# Patient Record
Sex: Male | Born: 1965 | Race: White | Hispanic: No | Marital: Married | Smoking: Never smoker
Health system: Southern US, Community
[De-identification: ages and names within clinical notes are randomized; demographics above are authoritative.]

## PROBLEM LIST (undated history)

## (undated) DIAGNOSIS — Z789 Other specified health status: Secondary | ICD-10-CM

## (undated) HISTORY — PX: NO PAST SURGERIES: SHX2092

---

## 2007-01-19 ENCOUNTER — Emergency Department (HOSPITAL_COMMUNITY): Admission: EM | Admit: 2007-01-19 | Discharge: 2007-01-19 | Payer: Self-pay | Admitting: Emergency Medicine

## 2010-09-30 LAB — CBC
HCT: 47.1
Hemoglobin: 16.4
RDW: 12.2

## 2010-09-30 LAB — I-STAT 8, (EC8 V) (CONVERTED LAB)
BUN: 15
Chloride: 104
Glucose, Bld: 110 — ABNORMAL HIGH
pCO2, Ven: 40.4 — ABNORMAL LOW
pH, Ven: 7.428 — ABNORMAL HIGH

## 2010-09-30 LAB — DIFFERENTIAL
Basophils Absolute: 0
Eosinophils Relative: 0
Lymphocytes Relative: 15
Lymphs Abs: 1.3
Monocytes Absolute: 0.5
Monocytes Relative: 6

## 2010-09-30 LAB — POCT I-STAT CREATININE
Creatinine, Ser: 1.4
Operator id: 239701

## 2011-10-28 ENCOUNTER — Encounter (HOSPITAL_COMMUNITY): Payer: Self-pay | Admitting: *Deleted

## 2011-10-28 ENCOUNTER — Encounter (HOSPITAL_COMMUNITY): Payer: Self-pay | Admitting: Anesthesiology

## 2011-10-28 ENCOUNTER — Encounter (HOSPITAL_COMMUNITY): Payer: Self-pay | Admitting: Emergency Medicine

## 2011-10-28 ENCOUNTER — Encounter (HOSPITAL_COMMUNITY)
Admission: EM | Disposition: A | Discharge status: Home / Self Care | Payer: Self-pay | Source: Home / Self Care | Attending: Emergency Medicine

## 2011-10-28 ENCOUNTER — Observation Stay (HOSPITAL_COMMUNITY)
Admission: EM | Admit: 2011-10-28 | Discharge: 2011-10-29 | Disposition: A | Discharge status: Home / Self Care | Payer: BC Managed Care – PPO | Attending: General Surgery | Admitting: General Surgery

## 2011-10-28 ENCOUNTER — Observation Stay (HOSPITAL_COMMUNITY): Payer: BC Managed Care – PPO | Admitting: Anesthesiology

## 2011-10-28 ENCOUNTER — Emergency Department (HOSPITAL_COMMUNITY)
Admission: EM | Admit: 2011-10-28 | Discharge: 2011-10-28 | Disposition: A | Discharge status: Nursing Home (Includes ICF) | Payer: Self-pay | Source: Home / Self Care | Attending: Family Medicine | Admitting: Family Medicine

## 2011-10-28 ENCOUNTER — Emergency Department (HOSPITAL_COMMUNITY): Payer: BC Managed Care – PPO

## 2011-10-28 DIAGNOSIS — R52 Pain, unspecified: Secondary | ICD-10-CM

## 2011-10-28 DIAGNOSIS — K3580 Unspecified acute appendicitis: Principal | ICD-10-CM | POA: Insufficient documentation

## 2011-10-28 DIAGNOSIS — R1032 Left lower quadrant pain: Secondary | ICD-10-CM

## 2011-10-28 DIAGNOSIS — Z7982 Long term (current) use of aspirin: Secondary | ICD-10-CM | POA: Insufficient documentation

## 2011-10-28 DIAGNOSIS — K8020 Calculus of gallbladder without cholecystitis without obstruction: Secondary | ICD-10-CM | POA: Insufficient documentation

## 2011-10-28 DIAGNOSIS — Z79899 Other long term (current) drug therapy: Secondary | ICD-10-CM | POA: Insufficient documentation

## 2011-10-28 HISTORY — DX: Other specified health status: Z78.9

## 2011-10-28 HISTORY — PX: LAPAROSCOPIC APPENDECTOMY: SHX408

## 2011-10-28 LAB — CBC WITH DIFFERENTIAL/PLATELET
Basophils Absolute: 0 10*3/uL (ref 0.0–0.1)
HCT: 44.7 % (ref 39.0–52.0)
Hemoglobin: 15.7 g/dL (ref 13.0–17.0)
Lymphocytes Relative: 7 % — ABNORMAL LOW (ref 12–46)
Monocytes Absolute: 0.9 10*3/uL (ref 0.1–1.0)
Monocytes Relative: 6 % (ref 3–12)
Neutro Abs: 14.1 10*3/uL — ABNORMAL HIGH (ref 1.7–7.7)
Neutrophils Relative %: 88 % — ABNORMAL HIGH (ref 43–77)
RDW: 12.5 % (ref 11.5–15.5)
WBC: 16 10*3/uL — ABNORMAL HIGH (ref 4.0–10.5)

## 2011-10-28 LAB — BASIC METABOLIC PANEL
CO2: 23 mEq/L (ref 19–32)
Chloride: 102 mEq/L (ref 96–112)
Creatinine, Ser: 0.85 mg/dL (ref 0.50–1.35)
Potassium: 4.3 mEq/L (ref 3.5–5.1)

## 2011-10-28 LAB — POCT URINALYSIS DIP (DEVICE)
Bilirubin Urine: NEGATIVE
Ketones, ur: 15 mg/dL — AB
Leukocytes, UA: NEGATIVE
Nitrite: NEGATIVE

## 2011-10-28 SURGERY — APPENDECTOMY, LAPAROSCOPIC
Anesthesia: General | Site: Abdomen | Wound class: Clean Contaminated

## 2011-10-28 MED ORDER — KETOROLAC TROMETHAMINE 30 MG/ML IJ SOLN
30.0000 mg | Freq: Four times a day (QID) | INTRAMUSCULAR | Status: AC
  Administered 2011-10-29 (×2): 30 mg via INTRAVENOUS
  Filled 2011-10-28 (×2): qty 1

## 2011-10-28 MED ORDER — DIPHENHYDRAMINE HCL 12.5 MG/5ML PO ELIX
12.5000 mg | ORAL_SOLUTION | Freq: Four times a day (QID) | ORAL | Status: DC | PRN
  Filled 2011-10-28: qty 10

## 2011-10-28 MED ORDER — SODIUM CHLORIDE 0.9 % IV SOLN
3.0000 g | Freq: Four times a day (QID) | INTRAVENOUS | Status: DC
  Administered 2011-10-29 (×2): 3 g via INTRAVENOUS
  Filled 2011-10-28 (×3): qty 3

## 2011-10-28 MED ORDER — PROMETHAZINE HCL 25 MG/ML IJ SOLN: 6.2500 mg | INTRAMUSCULAR | Status: DC | PRN

## 2011-10-28 MED ORDER — ENOXAPARIN SODIUM 30 MG/0.3ML ~~LOC~~ SOLN
30.0000 mg | Freq: Two times a day (BID) | SUBCUTANEOUS | Status: DC
  Filled 2011-10-28: qty 0.3

## 2011-10-28 MED ORDER — HYDROMORPHONE HCL PF 1 MG/ML IJ SOLN
INTRAMUSCULAR | Status: AC
  Filled 2011-10-28: qty 2

## 2011-10-28 MED ORDER — SUCCINYLCHOLINE CHLORIDE 20 MG/ML IJ SOLN
INTRAMUSCULAR | Status: DC | PRN
  Administered 2011-10-28: 120 mg via INTRAVENOUS

## 2011-10-28 MED ORDER — MORPHINE SULFATE 4 MG/ML IJ SOLN: 1.0000 mg | INTRAMUSCULAR | Status: DC | PRN

## 2011-10-28 MED ORDER — NEOSTIGMINE METHYLSULFATE 1 MG/ML IJ SOLN
INTRAMUSCULAR | Status: DC | PRN
  Administered 2011-10-28: 3 mg via INTRAVENOUS

## 2011-10-28 MED ORDER — BUPIVACAINE-EPINEPHRINE (PF) 0.5% -1:200000 IJ SOLN
INTRAMUSCULAR | Status: AC
  Filled 2011-10-28: qty 10

## 2011-10-28 MED ORDER — ONDANSETRON 4 MG PO TBDP
ORAL_TABLET | ORAL | Status: AC
  Filled 2011-10-28: qty 2

## 2011-10-28 MED ORDER — GLYCOPYRROLATE 0.2 MG/ML IJ SOLN
INTRAMUSCULAR | Status: DC | PRN
  Administered 2011-10-28: 0.4 mg via INTRAVENOUS

## 2011-10-28 MED ORDER — CEFAZOLIN SODIUM-DEXTROSE 2-3 GM-% IV SOLR
INTRAVENOUS | Status: AC
  Filled 2011-10-28: qty 50

## 2011-10-28 MED ORDER — HYDROMORPHONE HCL PF 1 MG/ML IJ SOLN
1.0000 mg | Freq: Once | INTRAMUSCULAR | Status: AC
  Administered 2011-10-28: 1 mg via INTRAVENOUS
  Filled 2011-10-28: qty 1

## 2011-10-28 MED ORDER — FENTANYL CITRATE 0.05 MG/ML IJ SOLN
INTRAMUSCULAR | Status: DC | PRN
  Administered 2011-10-28 (×3): 50 ug via INTRAVENOUS
  Administered 2011-10-28: 100 ug via INTRAVENOUS

## 2011-10-28 MED ORDER — ONDANSETRON 4 MG PO TBDP
8.0000 mg | ORAL_TABLET | Freq: Once | ORAL | Status: AC
  Administered 2011-10-28: 8 mg via ORAL

## 2011-10-28 MED ORDER — HYDROMORPHONE HCL PF 1 MG/ML IJ SOLN: 0.2500 mg | INTRAMUSCULAR | Status: DC | PRN

## 2011-10-28 MED ORDER — POTASSIUM CHLORIDE IN NACL 20-0.9 MEQ/L-% IV SOLN
INTRAVENOUS | Status: DC
  Administered 2011-10-29: via INTRAVENOUS
  Filled 2011-10-28 (×3): qty 1000

## 2011-10-28 MED ORDER — IOHEXOL 300 MG/ML  SOLN
100.0000 mL | Freq: Once | INTRAMUSCULAR | Status: AC | PRN
  Administered 2011-10-28: 100 mL via INTRAVENOUS

## 2011-10-28 MED ORDER — DIPHENHYDRAMINE HCL 50 MG/ML IJ SOLN: 12.5000 mg | Freq: Four times a day (QID) | INTRAMUSCULAR | Status: DC | PRN

## 2011-10-28 MED ORDER — KCL IN DEXTROSE-NACL 20-5-0.45 MEQ/L-%-% IV SOLN
INTRAVENOUS | Status: DC
  Filled 2011-10-28 (×2): qty 1000

## 2011-10-28 MED ORDER — PROPOFOL 10 MG/ML IV BOLUS
INTRAVENOUS | Status: DC | PRN
  Administered 2011-10-28: 200 mg via INTRAVENOUS

## 2011-10-28 MED ORDER — LACTATED RINGERS IV SOLN
INTRAVENOUS | Status: DC
  Administered 2011-10-28 (×2): via INTRAVENOUS

## 2011-10-28 MED ORDER — HYDROMORPHONE HCL PF 1 MG/ML IJ SOLN: 1.0000 mg | INTRAMUSCULAR | Status: DC | PRN

## 2011-10-28 MED ORDER — LIDOCAINE HCL (CARDIAC) 20 MG/ML IV SOLN
INTRAVENOUS | Status: DC | PRN
  Administered 2011-10-28: 100 mg via INTRAVENOUS

## 2011-10-28 MED ORDER — CEFAZOLIN SODIUM-DEXTROSE 2-3 GM-% IV SOLR
INTRAVENOUS | Status: DC | PRN
  Administered 2011-10-28: 2 g via INTRAVENOUS

## 2011-10-28 MED ORDER — OXYCODONE HCL 5 MG/5ML PO SOLN: 5.0000 mg | Freq: Once | ORAL | Status: DC | PRN

## 2011-10-28 MED ORDER — IOHEXOL 300 MG/ML  SOLN
20.0000 mL | INTRAMUSCULAR | Status: AC
  Administered 2011-10-28 (×2): 20 mL via ORAL

## 2011-10-28 MED ORDER — ROCURONIUM BROMIDE 100 MG/10ML IV SOLN
INTRAVENOUS | Status: DC | PRN
  Administered 2011-10-28 (×2): 20 mg via INTRAVENOUS

## 2011-10-28 MED ORDER — HYDROCODONE-ACETAMINOPHEN 5-325 MG PO TABS: 1.0000 | ORAL_TABLET | ORAL | Status: DC | PRN

## 2011-10-28 MED ORDER — ONDANSETRON HCL 4 MG/2ML IJ SOLN: 4.0000 mg | Freq: Four times a day (QID) | INTRAMUSCULAR | Status: DC | PRN

## 2011-10-28 MED ORDER — ACETAMINOPHEN 650 MG RE SUPP: 650.0000 mg | Freq: Four times a day (QID) | RECTAL | Status: DC | PRN

## 2011-10-28 MED ORDER — ONDANSETRON HCL 4 MG PO TABS: 4.0000 mg | ORAL_TABLET | Freq: Four times a day (QID) | ORAL | Status: DC | PRN

## 2011-10-28 MED ORDER — OXYCODONE HCL 5 MG PO TABS: 5.0000 mg | ORAL_TABLET | Freq: Once | ORAL | Status: DC | PRN

## 2011-10-28 MED ORDER — HYDROMORPHONE HCL PF 1 MG/ML IJ SOLN
2.0000 mg | Freq: Once | INTRAMUSCULAR | Status: AC
  Administered 2011-10-28: 2 mg via INTRAMUSCULAR

## 2011-10-28 MED ORDER — MIDAZOLAM HCL 5 MG/5ML IJ SOLN
INTRAMUSCULAR | Status: DC | PRN
  Administered 2011-10-28: 2 mg via INTRAVENOUS

## 2011-10-28 MED ORDER — PIPERACILLIN-TAZOBACTAM 3.375 G IVPB
3.3750 g | Freq: Three times a day (TID) | INTRAVENOUS | Status: DC
  Administered 2011-10-28: 3.375 g via INTRAVENOUS
  Filled 2011-10-28 (×2): qty 50

## 2011-10-28 MED ORDER — BUPIVACAINE-EPINEPHRINE 0.5% -1:200000 IJ SOLN
INTRAMUSCULAR | Status: DC | PRN
  Administered 2011-10-28: 20 mL

## 2011-10-28 MED ORDER — SODIUM CHLORIDE 0.9 % IR SOLN
Status: DC | PRN
  Administered 2011-10-28: 1

## 2011-10-28 MED ORDER — ACETAMINOPHEN 325 MG PO TABS: 650.0000 mg | ORAL_TABLET | Freq: Four times a day (QID) | ORAL | Status: DC | PRN

## 2011-10-28 MED ORDER — ENOXAPARIN SODIUM 40 MG/0.4ML ~~LOC~~ SOLN
40.0000 mg | Freq: Every day | SUBCUTANEOUS | Status: DC
  Filled 2011-10-28: qty 0.4

## 2011-10-28 MED ORDER — KETOROLAC TROMETHAMINE 30 MG/ML IJ SOLN
INTRAMUSCULAR | Status: DC | PRN
  Administered 2011-10-28: 30 mg via INTRAVENOUS

## 2011-10-28 MED ORDER — ONDANSETRON HCL 4 MG/2ML IJ SOLN
4.0000 mg | Freq: Once | INTRAMUSCULAR | Status: AC
  Administered 2011-10-28: 4 mg via INTRAVENOUS
  Filled 2011-10-28: qty 2

## 2011-10-28 MED ORDER — ONDANSETRON HCL 4 MG/2ML IJ SOLN
INTRAMUSCULAR | Status: DC | PRN
  Administered 2011-10-28: 4 mg via INTRAVENOUS

## 2011-10-28 MED ORDER — IBUPROFEN 600 MG PO TABS
600.0000 mg | ORAL_TABLET | Freq: Four times a day (QID) | ORAL | Status: DC | PRN
  Filled 2011-10-28: qty 1

## 2011-10-28 SURGICAL SUPPLY — 42 items
APL SKNCLS STERI-STRIP NONHPOA (GAUZE/BANDAGES/DRESSINGS) ×1
APPLIER CLIP LOGIC TI 5 (MISCELLANEOUS) IMPLANT
APPLIER CLIP ROT 10 11.4 M/L (STAPLE)
APR CLP MED LRG 11.4X10 (STAPLE)
APR CLP MED LRG 33X5 (MISCELLANEOUS)
BAG SPEC RTRVL LRG 6X4 10 (ENDOMECHANICALS) ×1
BANDAGE ADHESIVE 1X3 (GAUZE/BANDAGES/DRESSINGS) ×2 IMPLANT
BENZOIN TINCTURE PRP APPL 2/3 (GAUZE/BANDAGES/DRESSINGS) ×2 IMPLANT
CANISTER SUCTION 2500CC (MISCELLANEOUS) ×2 IMPLANT
CHLORAPREP W/TINT 26ML (MISCELLANEOUS) ×2 IMPLANT
CLIP APPLIE ROT 10 11.4 M/L (STAPLE) IMPLANT
CLOTH BEACON ORANGE TIMEOUT ST (SAFETY) ×2 IMPLANT
COVER SURGICAL LIGHT HANDLE (MISCELLANEOUS) ×2 IMPLANT
CUTTER LINEAR ENDO 35 ETS (STAPLE) ×1 IMPLANT
CUTTER LINEAR ENDO 35 ETS TH (STAPLE) IMPLANT
DECANTER SPIKE VIAL GLASS SM (MISCELLANEOUS) ×1 IMPLANT
ELECT REM PT RETURN 9FT ADLT (ELECTROSURGICAL) ×2
ELECTRODE REM PT RTRN 9FT ADLT (ELECTROSURGICAL) ×1 IMPLANT
ENDOLOOP SUT PDS II  0 18 (SUTURE)
ENDOLOOP SUT PDS II 0 18 (SUTURE) IMPLANT
GLOVE SURG SIGNA 7.5 PF LTX (GLOVE) ×2 IMPLANT
GOWN PREVENTION PLUS XLARGE (GOWN DISPOSABLE) ×2 IMPLANT
GOWN STRL NON-REIN LRG LVL3 (GOWN DISPOSABLE) ×2 IMPLANT
KIT BASIN OR (CUSTOM PROCEDURE TRAY) ×2 IMPLANT
KIT ROOM TURNOVER OR (KITS) ×2 IMPLANT
NS IRRIG 1000ML POUR BTL (IV SOLUTION) ×2 IMPLANT
PAD ARMBOARD 7.5X6 YLW CONV (MISCELLANEOUS) ×4 IMPLANT
POUCH SPECIMEN RETRIEVAL 10MM (ENDOMECHANICALS) ×2 IMPLANT
RELOAD /EVU35 (ENDOMECHANICALS) IMPLANT
RELOAD CUTTER ETS 35MM STAND (ENDOMECHANICALS) IMPLANT
SCALPEL HARMONIC ACE (MISCELLANEOUS) ×2 IMPLANT
SET IRRIG TUBING LAPAROSCOPIC (IRRIGATION / IRRIGATOR) ×2 IMPLANT
SLEEVE ENDOPATH XCEL 5M (ENDOMECHANICALS) ×2 IMPLANT
SPECIMEN JAR SMALL (MISCELLANEOUS) ×2 IMPLANT
STRIP CLOSURE SKIN 1/2X4 (GAUZE/BANDAGES/DRESSINGS) ×1 IMPLANT
SUT MON AB 4-0 PC3 18 (SUTURE) ×2 IMPLANT
TOWEL OR 17X24 6PK STRL BLUE (TOWEL DISPOSABLE) ×2 IMPLANT
TOWEL OR 17X26 10 PK STRL BLUE (TOWEL DISPOSABLE) ×2 IMPLANT
TRAY LAPAROSCOPIC (CUSTOM PROCEDURE TRAY) ×2 IMPLANT
TROCAR XCEL BLUNT TIP 100MML (ENDOMECHANICALS) ×2 IMPLANT
TROCAR XCEL NON-BLD 5MMX100MML (ENDOMECHANICALS) ×2 IMPLANT
WATER STERILE IRR 1000ML POUR (IV SOLUTION) IMPLANT

## 2011-10-28 NOTE — Anesthesia Postprocedure Evaluation (Signed)
Anesthesia Post Note  Patient: Luke Frey  Procedure(s) Performed: Procedure(s) (LRB): APPENDECTOMY LAPAROSCOPIC (N/A)  Anesthesia type: general  Patient location: PACU  Post pain: Pain level controlled  Post assessment: Patient's Cardiovascular Status Stable  Last Vitals:  Filed Vitals:   10/28/11 2200  BP: 109/64  Pulse: 60  Temp:   Resp: 10    Post vital signs: Reviewed and stable  Level of consciousness: sedated  Complications: No apparent anesthesia complications

## 2011-10-28 NOTE — Op Note (Signed)
Appendectomy, Lap, Procedure Note  Indications: The patient presented with a history of right-sided abdominal pain. A CT revealed findings consistent with acute appendicitis.  Pre-operative Diagnosis: Acute appendicitis without mention of peritonitis  Post-operative Diagnosis: Same  Surgeon: Abigail Miyamoto A   Assistants: 0  Anesthesia: General endotracheal anesthesia  ASA Class: 1  Procedure Details  The patient was seen again in the Holding Room. The risks, benefits, complications, treatment options, and expected outcomes were discussed with the patient and/or family. The possibilities of reaction to medication, perforation of viscus, bleeding, recurrent infection, finding a normal appendix, the need for additional procedures, failure to diagnose a condition, and creating a complication requiring transfusion or operation were discussed. There was concurrence with the proposed plan and informed consent was obtained. The site of surgery was properly noted. The patient was taken to Operating Room, identified as Requan Hardge and the procedure verified as Appendectomy. A Time Out was held and the above information confirmed.  The patient was placed in the supine position and general anesthesia was induced, along with placement of orogastric tube, Venodyne boots, and a Foley catheter. The abdomen was prepped and draped in a sterile fashion. A one centimeter infraumbilical incision was made.  The umbilical stalk was elevated, and the midline fascia was incised with a #11 blade.  A Kelly clamp was used to confirm entrance into the peritoneal cavity.  A pursestring suture was passed around the incision with a 0 Vicryl.  The Hasson was introduced into the abdomen and the tails of the suture were used to hold the Hasson in place.   The pneumoperitoneum was then established to steady pressure of 15 mmHg.  Additional 5 mm cannulas then placed in the left lower quadrant of the abdomen and the suprapubic  region under direct visualization. A careful evaluation of the entire abdomen was carried out. The patient was placed in Trendelenburg and left lateral decubitus position. The small intestines were retracted in the cephalad and left lateral direction away from the pelvis and right lower quadrant. The patient was found to have an enlarged and inflamed appendix that was extending into the pelvis. There was no evidence of perforation.  The appendix was carefully dissected. The appendix was was skeletonized with the harmonic scalpel.   The appendix was divided at its base using an endo-GIA stapler. Minimal appendiceal stump was left in place. There was no evidence of bleeding, leakage, or complication after division of the appendix. Irrigation was also performed and irrigate suctioned from the abdomen as well.  The umbilical port site was closed with the purse string suture. There was no residual palpable fascial defect.  The trocar site skin wounds were closed with 4-0 Monocryl.  Instrument, sponge, and needle counts were correct at the conclusion of the case.   Findings: The appendix was found to be inflamed. There were not signs of necrosis.  There was not perforation. There was not abscess formation.  Estimated Blood Loss:  Minimal         Drains:none         Complications:  None; patient tolerated the procedure well.         Disposition: PACU - hemodynamically stable.         Condition: stable

## 2011-10-28 NOTE — ED Provider Notes (Signed)
Medical screening examination/treatment/procedure(s) were performed by non-physician practitioner and as supervising physician I was immediately available for consultation/collaboration.   Glynn Octave, MD 10/28/11 [phone]

## 2011-10-28 NOTE — ED Notes (Signed)
Pt. Denies any abdominal pain.  Pt. Has completed the po Contrast.  Pt. Denies any n/v/d

## 2011-10-28 NOTE — ED Notes (Signed)
Pt. oob to the bathroom, gait steady, NPO maintained.

## 2011-10-28 NOTE — Progress Notes (Signed)
Report given to Angie, RN.

## 2011-10-28 NOTE — ED Provider Notes (Signed)
History     CSN: [id number]  Arrival date & time 10/28/11  0809   First MD Initiated Contact with Patient 10/28/11 [phone]      Chief Complaint  Patient presents with  . Abdominal Pain    (Consider location/radiation/quality/duration/timing/severity/associated sxs/prior treatment) Patient is a 46 y.o. male presenting with abdominal pain. The history is provided by the patient.  Abdominal Pain The primary symptoms of the illness include abdominal pain. The primary symptoms of the illness do not include fever, nausea, vomiting, diarrhea or dysuria. The current episode started 6 to 12 hours ago. The onset of the illness was sudden. The problem has not changed since onset. The patient has not had a change in bowel habit. Symptoms associated with the illness do not include chills, anorexia, heartburn, constipation, urgency, hematuria, frequency or back pain.    History reviewed. No pertinent past medical history.  History reviewed. No pertinent past surgical history.  No family history on file.  History  Substance Use Topics  . Smoking status: Never Smoker   . Smokeless tobacco: Not on file  . Alcohol Use: Yes      Review of Systems  Constitutional: Negative for fever and chills.  Respiratory: Negative.   Cardiovascular: Negative.   Gastrointestinal: Positive for abdominal pain. Negative for heartburn, nausea, vomiting, diarrhea, constipation and anorexia.  Genitourinary: Negative for dysuria, urgency, frequency, hematuria, flank pain and testicular pain.  Musculoskeletal: Negative for back pain.    Allergies  Review of patient's allergies indicates no known allergies.  Home Medications  No current outpatient prescriptions on file.  BP 135/75  Pulse 74  Temp 98.4 F (36.9 C)  Resp 16  SpO2 100%  Physical Exam  Nursing note and vitals reviewed. Constitutional: He is oriented to person, place, and time. He appears well-developed and well-nourished. He appears  distressed.  Neck: Normal range of motion. Neck supple.  Pulmonary/Chest: Effort normal and breath sounds normal.  Abdominal: Soft. Bowel sounds are normal. He exhibits no distension and no mass. There is no hepatosplenomegaly. There is tenderness in the suprapubic area and left lower quadrant. There is no rigidity, no rebound, no guarding, no CVA tenderness and no tenderness at McBurney's point. No hernia.    Neurological: He is alert and oriented to person, place, and time.  Skin: Skin is warm and dry.    ED Course  Procedures (including critical care time)  Labs Reviewed  POCT URINALYSIS DIP (DEVICE) - Abnormal; Notable for the following:    Ketones, ur 15 (*)     Protein, ur 30 (*)     All other components within normal limits   No results found.   1. Acute abdominal pain in left lower quadrant       MDM  U/a neg.       Linna Hoff, MD 10/28/11 [phone]

## 2011-10-28 NOTE — ED Provider Notes (Signed)
History     CSN: [id number]  Arrival date & time 10/28/11  2130   First MD Initiated Contact with Patient 10/28/11 1056      Chief Complaint  Patient presents with  . Abdominal Pain    (Consider location/radiation/quality/duration/timing/severity/associated sxs/prior treatment) HPI  Patient presents to the emergency department from the urgent care for evaluation of left lower quadrant abdominal pain. The patient's pain started 12 hours ago and has been constant with a 7/10. In. He denies having any nausea, vomiting, diarrhea or fevers at home. He was seen at the urgent care noted to have a normal urinalysis. He is otherwise a healthy guy without any previous abdominal surgeries. nad vss  No past medical history on file.  No past surgical history on file.  No family history on file.  History  Substance Use Topics  . Smoking status: Never Smoker   . Smokeless tobacco: Not on file  . Alcohol Use: Yes      Review of Systems   Review of Systems  Gen: no weight loss, fevers, chills, night sweats  Eyes: no discharge or drainage, no occular pain or visual changes  Nose: no epistaxis or rhinorrhea  Mouth: no dental pain, no sore throat  Neck: no neck pain  Lungs:No wheezing, coughing or hemoptysis CV: no chest pain, palpitations, dependent edema or orthopnea  Abd: + abdominal pain, no nausea, vomiting  GU: no dysuria or gross hematuria  MSK:  No abnormalities  Neuro: no headache, no focal neurologic deficits  Skin: no abnormalities Psyche: negative.    Allergies  Review of patient's allergies indicates no known allergies.  Home Medications   Current Outpatient Rx  Name Route Sig Dispense Refill  . ASPIRIN 325 MG PO TABS Oral Take 650 mg by mouth once as needed. pain    . AMOXICILLIN-POT CLAVULANATE 500-125 MG PO TABS Oral Take 1 tablet by mouth 2 (two) times daily. For 10 days Finished 10/14    . METRONIDAZOLE 500 MG PO TABS Oral Take 500 mg by mouth 3 (three)  times daily. For 10 days  Finished 10/14    . NAPROXEN SODIUM 550 MG PO TABS Oral Take 550 mg by mouth 3 (three) times daily. For 4 days Finished 10/14      BP 130/78  Pulse 68  Temp 98 F (36.7 C) (Oral)  Resp 16  SpO2 95%  Physical Exam  Nursing note and vitals reviewed. Constitutional: He appears well-developed and well-nourished. No distress.  HENT:  Head: Normocephalic and atraumatic.  Eyes: Pupils are equal, round, and reactive to light.  Neck: Normal range of motion. Neck supple.  Cardiovascular: Normal rate and regular rhythm.   Pulmonary/Chest: Effort normal.  Abdominal: Soft. Bowel sounds are normal. He exhibits no distension. There is tenderness (LLQ to periumbilical ). There is guarding. There is no rebound.  Neurological: He is alert.  Skin: Skin is warm and dry.    ED Course  Procedures (including critical care time)  Labs Reviewed  CBC WITH DIFFERENTIAL - Abnormal; Notable for the following:    WBC 16.0 (*)     Neutrophils Relative 88 (*)     Neutro Abs 14.1 (*)     Lymphocytes Relative 7 (*)     All other components within normal limits  BASIC METABOLIC PANEL   No results found.   No diagnosis found.    MDM  Pt has elevated white count. Normal urine at Urgent Care. I have ordered CT abd/pelv with contrast.  I suspect diverticulitis. IV dilaudid and zofran ordered. Pt placed in CDU to await results. Hand off to [facility], PA-C.        Dorthula Matas, PA 10/28/11 1114

## 2011-10-28 NOTE — Anesthesia Procedure Notes (Signed)
Procedure Name: Intubation Date/Time: 10/28/2011 8:16 PM Performed by: Arlice Colt B Pre-anesthesia Checklist: Patient identified, Emergency Drugs available, Suction available, Patient being monitored and Timeout performed Patient Re-evaluated:Patient Re-evaluated prior to inductionOxygen Delivery Method: Circle system utilized Preoxygenation: Pre-oxygenation with 100% oxygen Intubation Type: IV induction and Rapid sequence Laryngoscope Size: Mac and 4 Grade View: Grade I Tube type: Oral Tube size: 7.5 mm Number of attempts: 1 Airway Equipment and Method: Stylet Placement Confirmation: ETT inserted through vocal cords under direct vision,  positive ETCO2 and breath sounds checked- equal and bilateral Secured at: 23 cm Tube secured with: Tape Dental Injury: Teeth and Oropharynx as per pre-operative assessment

## 2011-10-28 NOTE — Anesthesia Preprocedure Evaluation (Signed)
Anesthesia Evaluation  Patient identified by MRN, date of birth, ID band Patient awake    Reviewed: Allergy & Precautions, H&P , NPO status , Patient's Chart, lab work & pertinent test results  Airway Mallampati: II TM Distance: >3 FB Neck ROM: full    Dental  (+) Teeth Intact and Dental Advidsory Given   Pulmonary neg pulmonary ROS,    Pulmonary exam normal       Cardiovascular     Neuro/Psych negative neurological ROS     GI/Hepatic Neg liver ROS,   Endo/Other  negative endocrine ROS  Renal/GU negative Renal ROS  negative genitourinary   Musculoskeletal   Abdominal Normal abdominal exam  (+)   Peds  Hematology   Anesthesia Other Findings   Reproductive/Obstetrics                           Anesthesia Physical Anesthesia Plan  ASA: II and Emergent  Anesthesia Plan: Rapid Sequence and General ETT   Post-op Pain Management:    Induction:   Airway Management Planned:   Additional Equipment:   Intra-op Plan:   Post-operative Plan:   Informed Consent: I have reviewed the patients History and Physical, chart, labs and discussed the procedure including the risks, benefits and alternatives for the proposed anesthesia with the patient or authorized representative who has indicated his/her understanding and acceptance.   Dental Advisory Given  Plan Discussed with: Anesthesiologist, CRNA and Surgeon  Anesthesia Plan Comments:         Anesthesia Quick Evaluation

## 2011-10-28 NOTE — ED Notes (Signed)
Onset today 0000 sudden onset LLQ radiating to LUQ. Seen at urgent care was given Dilaudid and pain currently 7/10 "sun burn feeling under the skin"  Denies nausea or emesis Normal bowel pattern.

## 2011-10-28 NOTE — ED Notes (Signed)
Report given to Liz on [address].

## 2011-10-28 NOTE — ED Notes (Signed)
Pt c/o abd pain since midnight... Sx include: pain that starts from left side radiating towards the middle.... Denies: inj/truama to site, fevers, vomiting, nausea, diarrhea, headaches, no urinary problems... Pt seems to be in severe pain and teary swinging side to side to alleviate the pain.

## 2011-10-28 NOTE — Transfer of Care (Signed)
Immediate Anesthesia Transfer of Care Note  Patient: Luke Frey  Procedure(s) Performed: Procedure(s) (LRB) with comments: APPENDECTOMY LAPAROSCOPIC (N/A)  Patient Location: PACU  Anesthesia Type: General  Level of Consciousness: awake, alert  and oriented  Airway & Oxygen Therapy: Patient Spontanous Breathing and Patient connected to nasal cannula oxygen  Post-op Assessment: Report given to PACU RN and Post -op Vital signs reviewed and stable  Post vital signs: Reviewed and stable  Complications: No apparent anesthesia complications

## 2011-10-28 NOTE — Progress Notes (Signed)
Patient ID: Luke Frey, male   DOB: June 19, 1965, 46 y.o.   MRN: [id number]  This is a 46 year old gentleman who presents with right lower quadrant abdominal pain. He has an elevated white blood count. He has tenderness on examination. His CAT scan is positive for appendicitis. I will plan to proceed to the operating room for laparoscopic appendectomy. I discussed the risks with the patient and his wife. These include but are not limited to bleeding, infection, injury to shredding structures, the appendiceal stump leak, need to convert to an open procedure, cardiopulmonary issues, et Karie Soda. He understands and wishes to proceed. Surgery is scheduled.

## 2011-10-28 NOTE — ED Provider Notes (Signed)
11:12 AM Patient to move to CDU holding for CT abd/pelvis.  Sign out received from Marlon Pel, PA-C.  Patient with LLQ pain, concern for diverticulitis.   12:33 PM Patient reports pain is well controlled at present, 2/10.  Denies nausea.  Abdomen is soft, nondistended, TTP lower abdomen L>R, no guarding, no rebound.    1:29 PM Marlon Pel, PA-C, received call from radiology, pt has acute appendicitis.    1:53 PM Discussed all CT results with patient.  Pt is NPO.  Consult placed to surgery.  Patient declines pain medication at this time.    3:07 PM Will Marlyne Beards, PA-C, from [facility] is here to see and admit the patient.    Results for orders placed during the hospital encounter of 10/28/11  CBC WITH DIFFERENTIAL      Component Value Range   WBC 16.0 (*) 4.0 - 10.5 K/uL   RBC 5.28  4.22 - 5.81 MIL/uL   Hemoglobin 15.7  13.0 - 17.0 g/dL   HCT 16.1  09.6 - 04.5 %   MCV 84.7  78.0 - 100.0 fL   MCH 29.7  26.0 - 34.0 pg   MCHC 35.1  30.0 - 36.0 g/dL   RDW 40.9  81.1 - 91.4 %   Platelets 270  150 - 400 K/uL   Neutrophils Relative 88 (*) 43 - 77 %   Neutro Abs 14.1 (*) 1.7 - 7.7 K/uL   Lymphocytes Relative 7 (*) 12 - 46 %   Lymphs Abs 1.1  0.7 - 4.0 K/uL   Monocytes Relative 6  3 - 12 %   Monocytes Absolute 0.9  0.1 - 1.0 K/uL   Eosinophils Relative 0  0 - 5 %   Eosinophils Absolute 0.0  0.0 - 0.7 K/uL   Basophils Relative 0  0 - 1 %   Basophils Absolute 0.0  0.0 - 0.1 K/uL  BASIC METABOLIC PANEL      Component Value Range   Sodium 139  135 - 145 mEq/L   Potassium 4.3  3.5 - 5.1 mEq/L   Chloride 102  96 - 112 mEq/L   CO2 23  19 - 32 mEq/L   Glucose, Bld 150 (*) 70 - 99 mg/dL   BUN 16  6 - 23 mg/dL   Creatinine, Ser 7.82  0.50 - 1.35 mg/dL   Calcium 9.7  8.4 - 95.6 mg/dL   GFR calc non Af Amer >90  >90 mL/min   GFR calc Af Amer >90  >90 mL/min   Ct Abdomen Pelvis W Contrast  10/28/2011  *RADIOLOGY REPORT*  Clinical Data: Abdominal pain  CT ABDOMEN AND PELVIS WITH CONTRAST   Technique:  Multidetector CT imaging of the abdomen and pelvis was performed following the standard protocol during bolus administration of intravenous contrast.  Contrast: OMNIPAQUE IOHEXOL 300 MG/ML  SOLN  Comparison: None.  Findings: Mild bibasilar scarring or atelectasis.  Diffuse low attenuation of the liver. A couple focal hypodensities within the right hepatic lobe are favored to be biliary cyst or hamartoma.  Unremarkable spleen, pancreas, adrenal glands. A small filling defect within the gallbladder may reflect a stone or polyp versus fold.  No gallbladder wall thickening.  No biliary ductal dilatation.  Symmetric renal enhancement.  No hydronephrosis or hydroureter. There are a couple cystic foci on the left, incompletely characterized. Simple or mildly complex cysts are favored.  Appendix is distended with periappendiceal inflammation and wall thickening.  Measures up to 12 mm.  No free intraperitoneal  air or fluid.  No loculated fluid collection.  No lymphadenopathy.  Normal caliber vasculature.  Thin-walled bladder.  No acute osseous finding.  IMPRESSION: Acute appendicitis.  Small gallstone versus polyp suggested.  Hypodensities within the left kidney are incompletely characterized.  Favored to be cysts.  Can be confirmed with a non emergent ultrasound follow-up.  Discussed via telephone with Marlon Pel at 01:25 p.m. on 10/28/2011.   Original Report Authenticated By: Waneta Martins, M.D.       [place], [place] 10/28/11 [phone]

## 2011-10-28 NOTE — Interval H&P Note (Signed)
History and Physical Interval Note:  Seen and agree with the PA  10/28/2011 3:45 PM  Teven Mittman  has presented today for surgery, with the diagnosis of acute appendicitis  The various methods of treatment have been discussed with the patient and family. After consideration of risks, benefits and other options for treatment, the patient has consented to  Procedure(s) (LRB) with comments: APPENDECTOMY LAPAROSCOPIC (N/A) as a surgical intervention .  The patient's history has been reviewed, patient examined, no change in status, stable for surgery.  I have reviewed the patient's chart and labs.  Questions were answered to the patient's satisfaction.     Keyshawna Prouse A

## 2011-10-28 NOTE — ED Provider Notes (Signed)
Medical screening examination/treatment/procedure(s) were performed by non-physician practitioner and as supervising physician I was immediately available for consultation/collaboration.   Akaysha Cobern, MD 10/28/11 1643 

## 2011-10-28 NOTE — H&P (Signed)
Luke Frey is an 46 y.o. male.   Chief Complaint: abdominal pain HPI: Patient is a healthy 46 year old male who awoke last evening around midnight with abdominal pain primarily in his left lower quadrant. It's been ongoing all night. He presented to the emergency room this morning around 11 AM. He's had no nausea or vomiting with this. No fever that is aware of no chills. He has not eaten since 8 PM last night. Workup in the ER as a white count of 16,000 hemoglobin 15.7. Hematocrit 44.7. Platelets 270,000. Electrolytes are normal UA was normal the CT scan shows a 12 mm appendix with periappendiceal inflammation and wall thickening there is no free intraperitoneal air or fluid. He does have some gallstones, or a small colon polyp. Rest to the patient in consultation. He's still having pain and tenderness primarily now in the right lower quadrant on palpation.  No past medical history on file. No medical history  No past surgical history on file. He recently had dental surgery and was on antibiotics which he completed last Monday. No family history on file. Social History:  reports that he has never smoked. He does not have any smokeless tobacco history on file. He reports that he drinks alcohol. He reports that he does not use illicit drugs. 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  Current meds: None Allergies: No Known Allergies Family history: Father is living with hx of sleep apnea and hypertension. Mother: Living in good health.  Siblings: 2 brothers both in good health one had his appendix removed.  (Not in a hospital admission) Prior to Admission medications   Medication Sig Start Date End Date Taking? Authorizing Provider  aspirin 325 MG tablet Take 650 mg by mouth once as needed. pain   Yes Historical Provider, MD  HYDROcodone-acetaminophen (NORCO/VICODIN) 5-325 MG per tablet Take 1-2 tablets by mouth every 4 (four) hours as needed. 10/29/11   Shelly Rubenstein, MD  metroNIDAZOLE (FLAGYL) 500 MG tablet Take 500 mg by mouth 3 (three) times daily. For 10 days  Finished 10/14    Historical Provider, MD  naproxen sodium (ANAPROX) 550 MG tablet Take 550 mg by mouth 3 (three) times daily. For 4 days Finished 10/14    Historical Provider, MD    Results for orders placed during the hospital encounter of 10/28/11 (from the past 48 hour(s))  CBC WITH DIFFERENTIAL     Status: Abnormal   Collection Time   10/28/11 10:21 AM      Component Value Range Comment   WBC 16.0 (*) 4.0 - 10.5 K/uL    RBC 5.28  4.22 - 5.81 MIL/uL    Hemoglobin 15.7  13.0 - 17.0 g/dL    HCT 96.0  45.4 - 09.8 %    MCV 84.7  78.0 - 100.0 fL    MCH 29.7  26.0 - 34.0 pg    MCHC 35.1  30.0 - 36.0 g/dL    RDW 11.9  14.7 - 82.9 %    Platelets 270  150 - 400 K/uL    Neutrophils Relative 88 (*) 43 - 77 %    Neutro Abs 14.1 (*) 1.7 - 7.7 K/uL    Lymphocytes Relative 7 (*) 12 - 46 %    Lymphs Abs 1.1  0.7 - 4.0 K/uL    Monocytes Relative 6  3 - 12 %    Monocytes Absolute 0.9  0.1 - 1.0 K/uL    Eosinophils Relative 0  0 - 5 %    Eosinophils Absolute 0.0  0.0 - 0.7 K/uL    Basophils Relative 0  0 - 1 %    Basophils Absolute 0.0  0.0 - 0.1 K/uL   BASIC METABOLIC PANEL     Status: Abnormal   Collection Time   10/28/11 10:21 AM      Component Value Range Comment   Sodium 139  135 - 145 mEq/L    Potassium 4.3  3.5 - 5.1 mEq/L    Chloride 102  96 - 112 mEq/L    CO2 23  19 - 32 mEq/L    Glucose, Bld 150 (*) 70 - 99 mg/dL    BUN 16  6 - 23 mg/dL    Creatinine, Ser 1.61  0.50 - 1.35 mg/dL    Calcium 9.7  8.4 - 09.6 mg/dL    GFR calc non Af Amer >90  >90 mL/min    GFR calc Af Amer >90  >90 mL/min    Ct Abdomen Pelvis W Contrast  10/28/2011  *RADIOLOGY REPORT*  Clinical Data: Abdominal pain  CT ABDOMEN AND PELVIS WITH CONTRAST  Technique:  Multidetector CT imaging of the abdomen and pelvis was performed following the standard protocol during bolus administration of intravenous  contrast.  Contrast: OMNIPAQUE IOHEXOL 300 MG/ML  SOLN  Comparison: None.  Findings: Mild bibasilar scarring or atelectasis.  Diffuse low attenuation of the liver. A couple focal hypodensities within the right hepatic lobe are favored to be biliary cyst or hamartoma.  Unremarkable spleen, pancreas, adrenal glands. A small filling defect within the gallbladder may reflect a stone or polyp versus fold.  No gallbladder wall thickening.  No biliary ductal dilatation.  Symmetric renal enhancement.  No hydronephrosis or hydroureter. There are a couple cystic foci on the left, incompletely characterized. Simple or mildly complex cysts are favored.  Appendix is distended with periappendiceal inflammation and wall thickening.  Measures up to 12 mm.  No free intraperitoneal air or fluid.  No loculated fluid collection.  No lymphadenopathy.  Normal caliber vasculature.  Thin-walled bladder.  No acute osseous finding.  IMPRESSION: Acute appendicitis.  Small gallstone versus polyp suggested.  Hypodensities within the left kidney are incompletely characterized.  Favored to be cysts.  Can be confirmed with a non emergent ultrasound follow-up.  Discussed via telephone with Luke Frey at 01:25 p.m. on 10/28/2011.   Original Report Authenticated By: Waneta Martins, M.D.     Review of Systems  Constitutional: Negative for fever, chills, weight loss, malaise/fatigue and diaphoresis.  HENT: Negative.   Eyes: Negative.  Eye pain: Started about MN no relief started in LLQ.  Respiratory: Negative.   Cardiovascular: Negative.   Gastrointestinal: Positive for abdominal pain. Negative for heartburn, nausea, vomiting, diarrhea, constipation, blood in stool and melena.  Genitourinary: Negative.   Musculoskeletal: Negative.   Skin: Negative.   Neurological: Negative.  Negative for weakness.  Endo/Heme/Allergies: Negative.   Psychiatric/Behavioral: Negative.     Blood pressure 121/73, pulse 69, temperature 97.9 F  (36.6 C), temperature source Oral, resp. rate 16, SpO2 99.00%. Physical Exam  Constitutional: He is oriented to person, place, and time. He appears well-developed and well-nourished. No distress.  HENT:  Head: Normocephalic and atraumatic.  Nose: Nose normal.  Eyes: Conjunctivae normal and EOM are normal. Pupils are equal, round, and reactive to light. Right eye exhibits no discharge. Left eye exhibits no discharge. No scleral icterus.  Neck: Normal range of motion. Neck supple. No JVD present.  No tracheal deviation present. No thyromegaly present.  Cardiovascular: Normal rate, regular rhythm, normal heart sounds and intact distal pulses.  Exam reveals no gallop.   No murmur heard. Respiratory: Effort normal and breath sounds normal. No stridor. No respiratory distress. He has no wheezes. He has no rales. He exhibits no tenderness.  GI: Soft. Bowel sounds are normal. He exhibits no distension and no mass. There is tenderness (He complains of LLQ, but on palpation he has more pain in RLQ than LLQ.). There is no rebound and no guarding.  Musculoskeletal: Normal range of motion. He exhibits no edema and no tenderness.  Lymphadenopathy:    He has no cervical adenopathy.  Neurological: He is alert and oriented to person, place, and time. No cranial nerve deficit.  Skin: Skin is warm and dry. No rash noted. He is not diaphoretic. No erythema. No pallor.  Psychiatric: He has a normal mood and affect. His behavior is normal. Judgment and thought content normal.     Assessment/Plan Impression: 1. Acute appendicitis  Plan: N.p.o., hydration, IV antibiotics, plan surgery later this evening. Will [facility] physician assistant for Dr. Violeta Gelinas. He has been seen and will be done by Dr. Magnus Ivan.   Kayliee Atienza 10/28/2011, 3:09 PM

## 2011-10-28 NOTE — H&P (View-Only) (Signed)
Patient ID: Luke Frey, male   DOB: 01/06/1966, 46 y.o.   MRN: [id number]  This is a 46-year-old gentleman who presents with right lower quadrant abdominal pain. He has an elevated white blood count. He has tenderness on examination. His CAT scan is positive for appendicitis. I will plan to proceed to the operating room for laparoscopic appendectomy. I discussed the risks with the patient and his wife. These include but are not limited to bleeding, infection, injury to shredding structures, the appendiceal stump leak, need to convert to an open procedure, cardiopulmonary issues, et cetera. He understands and wishes to proceed. Surgery is scheduled. 

## 2011-10-29 MED ORDER — HYDROCODONE-ACETAMINOPHEN 5-325 MG PO TABS: 1.0000 | ORAL_TABLET | ORAL | Status: DC | PRN

## 2011-10-29 NOTE — Discharge Summary (Signed)
Physician Discharge Summary  Patient ID: Luke Frey MRN: [id number] DOB/AGE: 10-02-1965 46 y.o.  Admit date: 10/28/2011 Discharge date: 10/29/2011  Admission Diagnoses:  Discharge Diagnoses:  Active Problems:  * No active hospital problems. *  acute appendicitis  Discharged Condition: good  Hospital Course: uneventful post op recovery.  Discharged POD#1  Consults: None  Significant Diagnostic Studies: CT  Treatments: surgery: laparoscopic appendectomy  Discharge Exam: Blood pressure 118/66, pulse 76, temperature 98.5 F (36.9 C), temperature source Oral, resp. rate 18, height 5\' 10"  (1.778 m), weight 190 lb (86.183 kg), SpO2 96.00%. General appearance: alert and no distress Resp: clear to auscultation bilaterally Cardio: regular rate and rhythm, S1, S2 normal, no murmur, click, rub or gallop Incision/Wound: incisions clean  Disposition: 04-Intermediate Care Facility     Medication List     As of 10/29/2011  7:07 AM    ASK your doctor about these medications         amoxicillin-clavulanate 500-125 MG per tablet   Commonly known as: AUGMENTIN   Take 1 tablet by mouth 2 (two) times daily. For 10 days  Finished 10/14      aspirin 325 MG tablet   Take 650 mg by mouth once as needed. pain      metroNIDAZOLE 500 MG tablet   Commonly known as: FLAGYL   Take 500 mg by mouth 3 (three) times daily. For 10 days   Finished 10/14      naproxen sodium 550 MG tablet   Commonly known as: ANAPROX   Take 550 mg by mouth 3 (three) times daily. For 4 days  Finished 10/14           Follow-up Information    Follow up with [facility] A, MD. (As needed)    Contact information:   [address] [place] [address] [phone]          Signed: Shelly Rubenstein 10/29/2011, 7:07 AM

## 2011-10-29 NOTE — Progress Notes (Signed)
1 Day Post-Op  Subjective: POD#1 comfortable  Objective: Vital signs in last 24 hours: Temp:  [97.1 F (36.2 C)-98.5 F (36.9 C)] 98.5 F (36.9 C) (10/19 0535) Pulse Rate:  [58-94] 76  (10/19 0535) Resp:  [10-29] 18  (10/19 0535) BP: (107-156)/(57-84) 118/66 mmHg (10/19 0535) SpO2:  [91 %-100 %] 96 % (10/19 0535) Weight:  [190 lb (86.183 kg)] 190 lb (86.183 kg) (10/18 2250) Last BM Date: 10/28/11  Intake/Output from previous day: 10/18 0701 - 10/19 0700 In: 1917 [I.V.:1917] Out: 1450 [Urine:1450] Intake/Output this shift:    Lungs clear Abdomen soft, dressings dry  Lab Results:   Basename 10/28/11 1021  WBC 16.0*  HGB 15.7  HCT 44.7  PLT 270   BMET  Basename 10/28/11 1021  NA 139  K 4.3  CL 102  CO2 23  GLUCOSE 150*  BUN 16  CREATININE 0.85  CALCIUM 9.7   PT/INR No results found for this basename: LABPROT:2,INR:2 in the last 72 hours ABG No results found for this basename: PHART:2,PCO2:2,PO2:2,HCO3:2 in the last 72 hours  Studies/Results: Ct Abdomen Pelvis W Contrast  10/28/2011  *RADIOLOGY REPORT*  Clinical Data: Abdominal pain  CT ABDOMEN AND PELVIS WITH CONTRAST  Technique:  Multidetector CT imaging of the abdomen and pelvis was performed following the standard protocol during bolus administration of intravenous contrast.  Contrast: OMNIPAQUE IOHEXOL 300 MG/ML  SOLN  Comparison: None.  Findings: Mild bibasilar scarring or atelectasis.  Diffuse low attenuation of the liver. A couple focal hypodensities within the right hepatic lobe are favored to be biliary cyst or hamartoma.  Unremarkable spleen, pancreas, adrenal glands. A small filling defect within the gallbladder may reflect a stone or polyp versus fold.  No gallbladder wall thickening.  No biliary ductal dilatation.  Symmetric renal enhancement.  No hydronephrosis or hydroureter. There are a couple cystic foci on the left, incompletely characterized. Simple or mildly complex cysts are favored.   Appendix is distended with periappendiceal inflammation and wall thickening.  Measures up to 12 mm.  No free intraperitoneal air or fluid.  No loculated fluid collection.  No lymphadenopathy.  Normal caliber vasculature.  Thin-walled bladder.  No acute osseous finding.  IMPRESSION: Acute appendicitis.  Small gallstone versus polyp suggested.  Hypodensities within the left kidney are incompletely characterized.  Favored to be cysts.  Can be confirmed with a non emergent ultrasound follow-up.  Discussed via telephone with Marlon Pel at 01:25 p.m. on 10/28/2011.   Original Report Authenticated By: Waneta Martins, M.D.     Anti-infectives: Anti-infectives     Start     Dose/Rate Route Frequency Ordered Stop   10/28/11 2359   Ampicillin-Sulbactam (UNASYN) 3 g in sodium chloride 0.9 % 100 mL IVPB        3 g 100 mL/hr over 60 Minutes Intravenous Every 6 hours 10/28/11 2300 10/29/11 1759   10/28/11 1515   piperacillin-tazobactam (ZOSYN) IVPB 3.375 g  Status:  Discontinued        3.375 g 12.5 mL/hr over 240 Minutes Intravenous 3 times per day 10/28/11 1508 10/28/11 2300          Assessment/Plan: s/p Procedure(s) (LRB) with comments: APPENDECTOMY LAPAROSCOPIC (N/A)  Discharge home  LOS: 1 day    Dhrithi Riche A 10/29/2011

## 2011-11-01 ENCOUNTER — Encounter (HOSPITAL_COMMUNITY): Payer: Self-pay | Admitting: Surgery

## 2016-08-18 ENCOUNTER — Other Ambulatory Visit: Payer: Self-pay | Admitting: Internal Medicine

## 2016-08-18 DIAGNOSIS — M7989 Other specified soft tissue disorders: Secondary | ICD-10-CM

## 2016-08-19 ENCOUNTER — Ambulatory Visit
Admission: RE | Admit: 2016-08-19 | Discharge: 2016-08-19 | Disposition: A | Discharge status: Home / Self Care | Payer: BC Managed Care – PPO | Source: Ambulatory Visit | Attending: Internal Medicine | Admitting: Internal Medicine

## 2016-08-19 DIAGNOSIS — M7989 Other specified soft tissue disorders: Secondary | ICD-10-CM

## 2019-05-28 ENCOUNTER — Other Ambulatory Visit: Payer: Self-pay | Admitting: Internal Medicine

## 2019-05-28 DIAGNOSIS — Z8249 Family history of ischemic heart disease and other diseases of the circulatory system: Secondary | ICD-10-CM

## 2019-05-28 DIAGNOSIS — E781 Pure hyperglyceridemia: Secondary | ICD-10-CM

## 2020-04-05 ENCOUNTER — Encounter (HOSPITAL_COMMUNITY): Payer: Self-pay | Admitting: Emergency Medicine

## 2020-04-05 ENCOUNTER — Emergency Department (HOSPITAL_COMMUNITY): Payer: BC Managed Care – PPO

## 2020-04-05 ENCOUNTER — Other Ambulatory Visit: Payer: Self-pay

## 2020-04-05 ENCOUNTER — Observation Stay (HOSPITAL_COMMUNITY)
Admission: EM | Admit: 2020-04-05 | Discharge: 2020-04-08 | Disposition: A | Discharge status: Home / Self Care | Payer: BC Managed Care – PPO | Attending: Student | Admitting: Student

## 2020-04-05 DIAGNOSIS — R350 Frequency of micturition: Secondary | ICD-10-CM

## 2020-04-05 DIAGNOSIS — E663 Overweight: Secondary | ICD-10-CM | POA: Insufficient documentation

## 2020-04-05 DIAGNOSIS — K5780 Diverticulitis of intestine, part unspecified, with perforation and abscess without bleeding: Secondary | ICD-10-CM | POA: Diagnosis not present

## 2020-04-05 DIAGNOSIS — Z6829 Body mass index (BMI) 29.0-29.9, adult: Secondary | ICD-10-CM | POA: Diagnosis not present

## 2020-04-05 DIAGNOSIS — Z20822 Contact with and (suspected) exposure to covid-19: Secondary | ICD-10-CM | POA: Insufficient documentation

## 2020-04-05 DIAGNOSIS — Z79899 Other long term (current) drug therapy: Secondary | ICD-10-CM | POA: Insufficient documentation

## 2020-04-05 DIAGNOSIS — Z7982 Long term (current) use of aspirin: Secondary | ICD-10-CM | POA: Diagnosis not present

## 2020-04-05 DIAGNOSIS — R1032 Left lower quadrant pain: Secondary | ICD-10-CM | POA: Diagnosis present

## 2020-04-05 DIAGNOSIS — K5732 Diverticulitis of large intestine without perforation or abscess without bleeding: Secondary | ICD-10-CM

## 2020-04-05 DIAGNOSIS — K5792 Diverticulitis of intestine, part unspecified, without perforation or abscess without bleeding: Secondary | ICD-10-CM | POA: Diagnosis not present

## 2020-04-05 DIAGNOSIS — A419 Sepsis, unspecified organism: Secondary | ICD-10-CM | POA: Diagnosis not present

## 2020-04-05 LAB — COMPREHENSIVE METABOLIC PANEL
ALT: 17 U/L (ref 0–44)
AST: 14 U/L — ABNORMAL LOW (ref 15–41)
Albumin: 4.3 g/dL (ref 3.5–5.0)
Alkaline Phosphatase: 58 U/L (ref 38–126)
Anion gap: 10 (ref 5–15)
BUN: 12 mg/dL (ref 6–20)
CO2: 23 mmol/L (ref 22–32)
Calcium: 8.8 mg/dL — ABNORMAL LOW (ref 8.9–10.3)
Chloride: 105 mmol/L (ref 98–111)
Creatinine, Ser: 1.09 mg/dL (ref 0.61–1.24)
GFR, Estimated: >60 mL/min (ref >60)
Glucose, Bld: 119 mg/dL — ABNORMAL HIGH (ref 70–99)
Potassium: 4 mmol/L (ref 3.5–5.1)
Sodium: 138 mmol/L (ref 135–145)
Total Bilirubin: 1.2 mg/dL (ref 0.3–1.2)
Total Protein: 7.7 g/dL (ref 6.5–8.1)

## 2020-04-05 LAB — CBC
HCT: 44.7 % (ref 39.0–52.0)
Hemoglobin: 15.4 g/dL (ref 13.0–17.0)
MCH: 29.9 pg (ref 26.0–34.0)
MCHC: 34.5 g/dL (ref 30.0–36.0)
MCV: 86.8 fL (ref 80.0–100.0)
Platelets: 223 10*3/uL (ref 150–400)
RBC: 5.15 MIL/uL (ref 4.22–5.81)
RDW: 12.4 % (ref 11.5–15.5)
WBC: 13 10*3/uL — ABNORMAL HIGH (ref 4.0–10.5)
nRBC: 0 % (ref 0.0–0.2)

## 2020-04-05 LAB — URINALYSIS, ROUTINE W REFLEX MICROSCOPIC
Bacteria, UA: NONE SEEN
Bilirubin Urine: NEGATIVE
Glucose, UA: NEGATIVE mg/dL
Ketones, ur: 20 mg/dL — AB
Leukocytes,Ua: NEGATIVE
Nitrite: NEGATIVE
Protein, ur: NEGATIVE mg/dL
Specific Gravity, Urine: >1.046 — ABNORMAL HIGH (ref 1.005–1.030)
pH: 5 (ref 5.0–8.0)

## 2020-04-05 LAB — RESP PANEL BY RT-PCR (FLU A&B, COVID) ARPGX2
Influenza A by PCR: NEGATIVE
Influenza B by PCR: NEGATIVE
SARS Coronavirus 2 by RT PCR: NEGATIVE

## 2020-04-05 LAB — HIV ANTIBODY (ROUTINE TESTING W REFLEX): HIV Screen 4th Generation wRfx: NONREACTIVE

## 2020-04-05 LAB — LIPASE, BLOOD: Lipase: 27 U/L (ref 11–51)

## 2020-04-05 LAB — LACTIC ACID, PLASMA: Lactic Acid, Venous: 1.1 mmol/L (ref 0.5–1.9)

## 2020-04-05 MED ORDER — ACETAMINOPHEN 650 MG RE SUPP: 650.0000 mg | Freq: Four times a day (QID) | RECTAL | Status: DC | PRN

## 2020-04-05 MED ORDER — HYDRALAZINE HCL 20 MG/ML IJ SOLN: 5.0000 mg | Freq: Three times a day (TID) | INTRAMUSCULAR | Status: DC | PRN

## 2020-04-05 MED ORDER — SODIUM CHLORIDE 0.9 % IV SOLN
2.0000 g | Freq: Once | INTRAVENOUS | Status: AC
  Administered 2020-04-05: 2 g via INTRAVENOUS
  Filled 2020-04-05: qty 20

## 2020-04-05 MED ORDER — LACTATED RINGERS IV BOLUS (SEPSIS)
1000.0000 mL | Freq: Once | INTRAVENOUS | Status: AC
  Administered 2020-04-05: 1000 mL via INTRAVENOUS

## 2020-04-05 MED ORDER — MORPHINE SULFATE (PF) 2 MG/ML IV SOLN
2.0000 mg | INTRAVENOUS | Status: DC | PRN
Start: 2020-04-05 — End: 2020-04-08

## 2020-04-05 MED ORDER — HYDROCODONE-ACETAMINOPHEN 5-325 MG PO TABS
1.0000 | ORAL_TABLET | ORAL | Status: DC | PRN
Start: 2020-04-05 — End: 2020-04-08
  Administered 2020-04-06 – 2020-04-07 (×2): 1 via ORAL
  Filled 2020-04-05 (×2): qty 1

## 2020-04-05 MED ORDER — SODIUM CHLORIDE 0.9 % IV SOLN
INTRAVENOUS | Status: DC | PRN
  Administered 2020-04-05: 500 mL via INTRAVENOUS

## 2020-04-05 MED ORDER — PIPERACILLIN-TAZOBACTAM 3.375 G IVPB
3.3750 g | Freq: Three times a day (TID) | INTRAVENOUS | Status: DC
  Administered 2020-04-05 – 2020-04-08 (×9): 3.375 g via INTRAVENOUS
  Filled 2020-04-05 (×9): qty 50

## 2020-04-05 MED ORDER — METRONIDAZOLE IN NACL 5-0.79 MG/ML-% IV SOLN: 500.0000 mg | Freq: Once | INTRAVENOUS | Status: DC

## 2020-04-05 MED ORDER — MORPHINE SULFATE (PF) 4 MG/ML IV SOLN
4.0000 mg | Freq: Once | INTRAVENOUS | Status: AC
  Administered 2020-04-05: 4 mg via INTRAVENOUS
  Filled 2020-04-05: qty 1

## 2020-04-05 MED ORDER — ENOXAPARIN SODIUM 40 MG/0.4ML ~~LOC~~ SOLN
40.0000 mg | SUBCUTANEOUS | Status: DC
  Administered 2020-04-05 – 2020-04-07 (×3): 40 mg via SUBCUTANEOUS
  Filled 2020-04-05 (×3): qty 0.4

## 2020-04-05 MED ORDER — ACETAMINOPHEN 325 MG PO TABS
650.0000 mg | ORAL_TABLET | Freq: Four times a day (QID) | ORAL | Status: DC | PRN
  Administered 2020-04-05: 650 mg via ORAL
  Filled 2020-04-05: qty 2

## 2020-04-05 MED ORDER — IOHEXOL 300 MG/ML  SOLN
100.0000 mL | Freq: Once | INTRAMUSCULAR | Status: AC | PRN
  Administered 2020-04-05: 100 mL via INTRAVENOUS

## 2020-04-05 MED ORDER — SODIUM CHLORIDE 0.9% FLUSH
3.0000 mL | Freq: Two times a day (BID) | INTRAVENOUS | Status: DC
  Administered 2020-04-05 – 2020-04-07 (×6): 3 mL via INTRAVENOUS

## 2020-04-05 MED ORDER — HYDROMORPHONE HCL 1 MG/ML IJ SOLN
1.0000 mg | Freq: Once | INTRAMUSCULAR | Status: AC
  Administered 2020-04-05: 1 mg via INTRAVENOUS
  Filled 2020-04-05: qty 1

## 2020-04-05 NOTE — Consult Note (Signed)
CC: LLQ pain  Requesting provider: Dr Dairl Ponder  HPI: Luke Frey is an 55 y.o. male who is here for increasing abd pain and fever since Sat evening.  He describes pain located in his LLQ that radiates to his L leg and scrotum.  He's never had anything like this before.  He has regular bowel habits and does not smoke.  Last colonoscopy was ~2 yrs ago with Dr Loreta Ave  Past Medical History:  Diagnosis Date   No pertinent past medical history     Past Surgical History:  Procedure Laterality Date   LAPAROSCOPIC APPENDECTOMY  10/28/2011   Procedure: APPENDECTOMY LAPAROSCOPIC;  Surgeon: Shelly Rubenstein, MD;  Location: [facility] OR;  Service: General;  Laterality: N/A;   NO PAST SURGERIES      History reviewed. No pertinent family history.  Social:  reports that he has never smoked. He has never used smokeless tobacco. He reports current alcohol use. He reports that he does not use drugs.  Allergies: No Known Allergies  Medications: I have reviewed the patient's current medications.  Results for orders placed or performed during the hospital encounter of 04/05/20 (from the past 48 hour(s))  Lipase, blood     Status: None   Collection Time: 04/05/20 11:07 AM  Result Value Ref Range   Lipase 27 11 - 51 U/L    Comment: Performed at [facility], [address] [place], [place] [address]  Comprehensive metabolic panel     Status: Abnormal   Collection Time: 04/05/20 11:07 AM  Result Value Ref Range   Sodium 138 135 - 145 mmol/L   Potassium 4.0 3.5 - 5.1 mmol/L   Chloride 105 98 - 111 mmol/L   CO2 23 22 - 32 mmol/L   Glucose, Bld 119 (H) 70 - 99 mg/dL    Comment: Glucose reference range applies only to samples taken after fasting for at least 8 hours.   BUN 12 6 - 20 mg/dL   Creatinine, Ser 6.04 0.61 - 1.24 mg/dL   Calcium 8.8 (L) 8.9 - 10.3 mg/dL   Total Protein 7.7 6.5 - 8.1 g/dL   Albumin 4.3 3.5 - 5.0 g/dL   AST 14 (L) 15 - 41 U/L   ALT 17 0 - 44 U/L   Alkaline  Phosphatase 58 38 - 126 U/L   Total Bilirubin 1.2 0.3 - 1.2 mg/dL   GFR, Estimated >54 >09 mL/min    Comment: (NOTE) Calculated using the CKD-EPI Creatinine Equation (2021)    Anion gap 10 5 - 15    Comment: Performed at [facility], [address] [place], [place] [address]  CBC     Status: Abnormal   Collection Time: 04/05/20 11:07 AM  Result Value Ref Range   WBC 13.0 (H) 4.0 - 10.5 K/uL   RBC 5.15 4.22 - 5.81 MIL/uL   Hemoglobin 15.4 13.0 - 17.0 g/dL   HCT 47.8 29.5 - 62.1 %   MCV 86.8 80.0 - 100.0 fL   MCH 29.9 26.0 - 34.0 pg   MCHC 34.5 30.0 - 36.0 g/dL   RDW 30.8 65.7 - 84.6 %   Platelets 223 150 - 400 K/uL   nRBC 0.0 0.0 - 0.2 %    Comment: Performed at [facility], [address], [place], [place] [address]  Resp Panel by RT-PCR (Flu A&B, Covid) Nasopharyngeal Swab     Status: None   Collection Time: 04/05/20 12:00 PM   Specimen: Nasopharyngeal Swab;  Nasopharyngeal(NP) swabs in vial transport medium  Result Value Ref Range   SARS Coronavirus 2 by RT PCR NEGATIVE NEGATIVE    Comment: (NOTE) SARS-CoV-2 target nucleic acids are NOT DETECTED.  The SARS-CoV-2 RNA is generally detectable in upper respiratory specimens during the acute phase of infection. The lowest concentration of SARS-CoV-2 viral copies this assay can detect is 138 copies/mL. A negative result does not preclude SARS-Cov-2 infection and should not be used as the sole basis for treatment or other patient management decisions. A negative result may occur with  improper specimen collection/handling, submission of specimen other than nasopharyngeal swab, presence of viral mutation(s) within the areas targeted by this assay, and inadequate number of viral copies(<138 copies/mL). A negative result must be combined with clinical observations, patient history, and epidemiological information. The expected result is Negative.  Fact Sheet for Patients:   BloggerCourse.com  Fact Sheet for Healthcare Providers:  SeriousBroker.it  This test is no t yet approved or cleared by the [place] FDA and  has been authorized for detection and/or diagnosis of SARS-CoV-2 by FDA under an Emergency Use Authorization (EUA). This EUA will remain  in effect (meaning this test can be used) for the duration of the COVID-19 declaration under Section 564(b)(1) of the Act, 21 U.S.C.section 360bbb-3(b)(1), unless the authorization is terminated  or revoked sooner.       Influenza A by PCR NEGATIVE NEGATIVE   Influenza B by PCR NEGATIVE NEGATIVE    Comment: (NOTE) The Xpert Xpress SARS-CoV-2/FLU/RSV plus assay is intended as an aid in the diagnosis of influenza from Nasopharyngeal swab specimens and should not be used as a sole basis for treatment. Nasal washings and aspirates are unacceptable for Xpert Xpress SARS-CoV-2/FLU/RSV testing.  Fact Sheet for Patients: BloggerCourse.com  Fact Sheet for Healthcare Providers: SeriousBroker.it  This test is not yet approved or cleared by the [place] FDA and has been authorized for detection and/or diagnosis of SARS-CoV-2 by FDA under an Emergency Use Authorization (EUA). This EUA will remain in effect (meaning this test can be used) for the duration of the COVID-19 declaration under Section 564(b)(1) of the Act, 21 U.S.C. section 360bbb-3(b)(1), unless the authorization is terminated or revoked.  Performed at [facility], [address], [place], [place] [address]   HIV Antibody (routine testing w rflx)     Status: None   Collection Time: 04/05/20  2:54 PM  Result Value Ref Range   HIV Screen 4th Generation wRfx Non Reactive Non Reactive    Comment: Performed at [facility], [address], [place], [place] [address]  Lactic acid, plasma     Status: None   Collection  Time: 04/05/20  3:36 PM  Result Value Ref Range   Lactic Acid, Venous 1.1 0.5 - 1.9 mmol/L    Comment: Performed at [facility], [address], [place], [place] [address]  Urinalysis, Routine w reflex microscopic Urine, Clean Catch     Status: Abnormal   Collection Time: 04/05/20  5:00 PM  Result Value Ref Range   Color, Urine YELLOW YELLOW   APPearance CLEAR CLEAR   Specific Gravity, Urine >1.046 (H) 1.005 - 1.030   pH 5.0 5.0 - 8.0   Glucose, UA NEGATIVE NEGATIVE mg/dL   Hgb urine dipstick SMALL (A) NEGATIVE   Bilirubin Urine NEGATIVE NEGATIVE   Ketones, ur 20 (A) NEGATIVE mg/dL   Protein, ur NEGATIVE NEGATIVE mg/dL   Nitrite NEGATIVE NEGATIVE   Leukocytes,Ua NEGATIVE NEGATIVE  RBC / HPF 0-5 0 - 5 RBC/hpf   WBC, UA 0-5 0 - 5 WBC/hpf   Bacteria, UA NONE SEEN NONE SEEN   Mucus PRESENT     Comment: Performed at [facility], [address], [place], [place] [address]    CT ABDOMEN PELVIS W CONTRAST  Result Date: 04/05/2020 CLINICAL DATA:  LEFT lower quadrant pain since yesterday with fever and change in bowel movements EXAM: CT ABDOMEN AND PELVIS WITH CONTRAST TECHNIQUE: Multidetector CT imaging of the abdomen and pelvis was performed using the standard protocol following bolus administration of intravenous contrast. Sagittal and coronal MPR images reconstructed from axial data set. CONTRAST:  OMNIPAQUE IOHEXOL 300 MG/ML SOLN IV. No oral contrast. COMPARISON:  None FINDINGS: Lower chest: Minimal dependent density/atelectasis at lung bases Hepatobiliary: Fatty infiltration of liver. Subtle nodular foci within gallbladder question small gallstones versus polyps. Two small probable hepatic cysts. Pancreas: Normal appearance Spleen: Normal appearance Adrenals/Urinary Tract: LEFT renal cysts. Adrenal glands, kidneys, ureters, and bladder otherwise normal appearance. Stomach/Bowel: Prior appendectomy. Diverticulosis of descending and sigmoid colon  with wall thickening and pericolic infiltrative changes at distal descending colon consistent with acute diverticulitis. Infiltration/edema extends into the proximal sigmoid mesocolon and lateral conal fascia as well as along the posterior pararenal fascia. Small focal collection of gas adjacent to colon 1.6 cm diameter consistent with contained local perforation. No discrete abscess. Stomach and remaining bowel loops unremarkable. Vascular/Lymphatic: Vascular structures patent.  No adenopathy. Reproductive: Prostate gland and seminal vesicles normal appearance Other: Small LEFT inguinal hernia containing fat. No free air or free fluid. Musculoskeletal: No acute osseous findings. IMPRESSION: Acute diverticulitis of the distal descending colon with pericolic infiltrative changes extending into the proximal sigmoid mesocolon and lateral conal fascia as well as along the posterior pararenal fascia. Small focal collection of gas adjacent to colon consistent with contained perforation. No free intraperitoneal air or discrete abscess. Fatty infiltration of liver with small probable hepatic cysts. Small LEFT inguinal hernia containing fat. Findings called to Trudee Grip PA on 04/05/2020 at 1157 hours. Electronically Signed   By: Ulyses Southward M.D.   On: 04/05/2020 11:58    ROS - all of the below systems have been reviewed with the patient and positives are indicated with bold text General: chills, fever or night sweats Eyes: blurry vision or double vision ENT: epistaxis or sore throat Allergy/Immunology: itchy/watery eyes or nasal congestion Hematologic/Lymphatic: bleeding problems, blood clots or swollen lymph nodes Endocrine: temperature intolerance or unexpected weight changes Breast: new or changing breast lumps or nipple discharge Resp: cough, shortness of breath, or wheezing CV: chest pain or dyspnea on exertion GI: as per HPI GU: dysuria, trouble voiding, or hematuria MSK: joint pain or joint  stiffness Neuro: TIA or stroke symptoms Derm: pruritus and skin lesion changes Psych: anxiety and depression  PE Blood pressure (!) 142/88, pulse 93, temperature 99.5 F (37.5 C), temperature source Oral, resp. rate 18, height 5\' 9"  (1.753 m), weight 90.7 kg, SpO2 96 %. Constitutional: NAD; conversant; no deformities Eyes: Moist conjunctiva; no lid lag; anicteric; PERRL Neck: Trachea midline; no thyromegaly Lungs: Normal respiratory effort; no tactile fremitus CV: RRR; no palpable thrills; no pitting edema GI: Abd TTP LLQ; no palpable hepatosplenomegaly MSK: Normal range of motion of extremities; no clubbing/cyanosis Psychiatric: Appropriate affect; alert and oriented x3 Lymphatic: No palpable cervical or axillary lymphadenopathy  Results for orders placed or performed during the hospital encounter of 04/05/20 (from the past 48 hour(s))  Lipase, blood  Status: None   Collection Time: 04/05/20 11:07 AM  Result Value Ref Range   Lipase 27 11 - 51 U/L    Comment: Performed at [facility], [address] [place], [place] [address]  Comprehensive metabolic panel     Status: Abnormal   Collection Time: 04/05/20 11:07 AM  Result Value Ref Range   Sodium 138 135 - 145 mmol/L   Potassium 4.0 3.5 - 5.1 mmol/L   Chloride 105 98 - 111 mmol/L   CO2 23 22 - 32 mmol/L   Glucose, Bld 119 (H) 70 - 99 mg/dL    Comment: Glucose reference range applies only to samples taken after fasting for at least 8 hours.   BUN 12 6 - 20 mg/dL   Creatinine, Ser 6.04 0.61 - 1.24 mg/dL   Calcium 8.8 (L) 8.9 - 10.3 mg/dL   Total Protein 7.7 6.5 - 8.1 g/dL   Albumin 4.3 3.5 - 5.0 g/dL   AST 14 (L) 15 - 41 U/L   ALT 17 0 - 44 U/L   Alkaline Phosphatase 58 38 - 126 U/L   Total Bilirubin 1.2 0.3 - 1.2 mg/dL   GFR, Estimated >54 >09 mL/min    Comment: (NOTE) Calculated using the CKD-EPI Creatinine Equation (2021)    Anion gap 10 5 - 15    Comment: Performed at [facility], [address] [place], [place] [address]  CBC     Status: Abnormal   Collection Time: 04/05/20 11:07 AM  Result Value Ref Range   WBC 13.0 (H) 4.0 - 10.5 K/uL   RBC 5.15 4.22 - 5.81 MIL/uL   Hemoglobin 15.4 13.0 - 17.0 g/dL   HCT 47.8 29.5 - 62.1 %   MCV 86.8 80.0 - 100.0 fL   MCH 29.9 26.0 - 34.0 pg   MCHC 34.5 30.0 - 36.0 g/dL   RDW 30.8 65.7 - 84.6 %   Platelets 223 150 - 400 K/uL   nRBC 0.0 0.0 - 0.2 %    Comment: Performed at [facility], [address] [place], [place] [address]  Resp Panel by RT-PCR (Flu A&B, Covid) Nasopharyngeal Swab     Status: None   Collection Time: 04/05/20 12:00 PM   Specimen: Nasopharyngeal Swab; Nasopharyngeal(NP) swabs in vial transport medium  Result Value Ref Range   SARS Coronavirus 2 by RT PCR NEGATIVE NEGATIVE    Comment: (NOTE) SARS-CoV-2 target nucleic acids are NOT DETECTED.  The SARS-CoV-2 RNA is generally detectable in upper respiratory specimens during the acute phase of infection. The lowest concentration of SARS-CoV-2 viral copies this assay can detect is 138 copies/mL. A negative result does not preclude SARS-Cov-2 infection and should not be used as the sole basis for treatment or other patient management decisions. A negative result may occur with  improper specimen collection/handling, submission of specimen other than nasopharyngeal swab, presence of viral mutation(s) within the areas targeted by this assay, and inadequate number of viral copies(<138 copies/mL). A negative result must be combined with clinical observations, patient history, and epidemiological information. The expected result is Negative.  Fact Sheet for Patients:  BloggerCourse.com  Fact Sheet for Healthcare Providers:  SeriousBroker.it  This test is no t yet approved or cleared by the [place] FDA and  has been authorized for detection and/or diagnosis of SARS-CoV-2  by FDA under an Emergency Use Authorization (EUA). This EUA will remain  in effect (meaning this test can be used) for the duration  of the COVID-19 declaration under Section 564(b)(1) of the Act, 21 U.S.C.section 360bbb-3(b)(1), unless the authorization is terminated  or revoked sooner.       Influenza A by PCR NEGATIVE NEGATIVE   Influenza B by PCR NEGATIVE NEGATIVE    Comment: (NOTE) The Xpert Xpress SARS-CoV-2/FLU/RSV plus assay is intended as an aid in the diagnosis of influenza from Nasopharyngeal swab specimens and should not be used as a sole basis for treatment. Nasal washings and aspirates are unacceptable for Xpert Xpress SARS-CoV-2/FLU/RSV testing.  Fact Sheet for Patients: BloggerCourse.com  Fact Sheet for Healthcare Providers: SeriousBroker.it  This test is not yet approved or cleared by the [place] FDA and has been authorized for detection and/or diagnosis of SARS-CoV-2 by FDA under an Emergency Use Authorization (EUA). This EUA will remain in effect (meaning this test can be used) for the duration of the COVID-19 declaration under Section 564(b)(1) of the Act, 21 U.S.C. section 360bbb-3(b)(1), unless the authorization is terminated or revoked.  Performed at [facility], [address], [place], [place] [address]   HIV Antibody (routine testing w rflx)     Status: None   Collection Time: 04/05/20  2:54 PM  Result Value Ref Range   HIV Screen 4th Generation wRfx Non Reactive Non Reactive    Comment: Performed at [facility], [address] [place], [place] [address]  Lactic acid, plasma     Status: None   Collection Time: 04/05/20  3:36 PM  Result Value Ref Range   Lactic Acid, Venous 1.1 0.5 - 1.9 mmol/L    Comment: Performed at [facility], [address], [place], [place] [address]  Urinalysis, Routine w reflex microscopic Urine, Clean Catch     Status:  Abnormal   Collection Time: 04/05/20  5:00 PM  Result Value Ref Range   Color, Urine YELLOW YELLOW   APPearance CLEAR CLEAR   Specific Gravity, Urine >1.046 (H) 1.005 - 1.030   pH 5.0 5.0 - 8.0   Glucose, UA NEGATIVE NEGATIVE mg/dL   Hgb urine dipstick SMALL (A) NEGATIVE   Bilirubin Urine NEGATIVE NEGATIVE   Ketones, ur 20 (A) NEGATIVE mg/dL   Protein, ur NEGATIVE NEGATIVE mg/dL   Nitrite NEGATIVE NEGATIVE   Leukocytes,Ua NEGATIVE NEGATIVE   RBC / HPF 0-5 0 - 5 RBC/hpf   WBC, UA 0-5 0 - 5 WBC/hpf   Bacteria, UA NONE SEEN NONE SEEN   Mucus PRESENT     Comment: Performed at [facility], [address], [place], [place] [address]    CT ABDOMEN PELVIS W CONTRAST  Result Date: 04/05/2020 CLINICAL DATA:  LEFT lower quadrant pain since yesterday with fever and change in bowel movements EXAM: CT ABDOMEN AND PELVIS WITH CONTRAST TECHNIQUE: Multidetector CT imaging of the abdomen and pelvis was performed using the standard protocol following bolus administration of intravenous contrast. Sagittal and coronal MPR images reconstructed from axial data set. CONTRAST:  OMNIPAQUE IOHEXOL 300 MG/ML SOLN IV. No oral contrast. COMPARISON:  None FINDINGS: Lower chest: Minimal dependent density/atelectasis at lung bases Hepatobiliary: Fatty infiltration of liver. Subtle nodular foci within gallbladder question small gallstones versus polyps. Two small probable hepatic cysts. Pancreas: Normal appearance Spleen: Normal appearance Adrenals/Urinary Tract: LEFT renal cysts. Adrenal glands, kidneys, ureters, and bladder otherwise normal appearance. Stomach/Bowel: Prior appendectomy. Diverticulosis of descending and sigmoid colon with wall thickening and pericolic infiltrative changes at distal descending colon consistent with acute diverticulitis. Infiltration/edema extends into the proximal  sigmoid mesocolon and lateral conal fascia as well as along the posterior pararenal fascia. Small focal  collection of gas adjacent to colon 1.6 cm diameter consistent with contained local perforation. No discrete abscess. Stomach and remaining bowel loops unremarkable. Vascular/Lymphatic: Vascular structures patent.  No adenopathy. Reproductive: Prostate gland and seminal vesicles normal appearance Other: Small LEFT inguinal hernia containing fat. No free air or free fluid. Musculoskeletal: No acute osseous findings. IMPRESSION: Acute diverticulitis of the distal descending colon with pericolic infiltrative changes extending into the proximal sigmoid mesocolon and lateral conal fascia as well as along the posterior pararenal fascia. Small focal collection of gas adjacent to colon consistent with contained perforation. No free intraperitoneal air or discrete abscess. Fatty infiltration of liver with small probable hepatic cysts. Small LEFT inguinal hernia containing fat. Findings called to Trudee GripMaria Belaya PA on 04/05/2020 at 1157 hours. Electronically Signed   By: Ulyses SouthwardMark  Boles M.D.   On: 04/05/2020 11:58     A/P: Brennan BaileyMark Frey is an 55 y.o. male with LLQ pain and CT showing diverticulitis of the descending colon with a small focal collection of extraluminal gas without abscess.  Pt admitted and IV abx started.  Will follow with you.  Ok for clears today.    Pt will need GI f/u as an outpatient  Vanita PandaAlicia C Thomas, MD  Colorectal and General Surgery [facility]

## 2020-04-05 NOTE — Progress Notes (Signed)
Pharmacy Antibiotic Note  Luke Frey is a 55 y.o. male admitted on 04/05/2020 with Sepsis, acute diverticulitis with contained perforation.  Pharmacy has been consulted for Zosyn dosing.  Plan: Zosyn 3.375g IV Q8H infused over 4hrs.  Follow up renal function, culture results, and clinical course.   Height: 5\' 9"  (175.3 cm) Weight: 90.7 kg (200 lb) IBW/kg (Calculated) : 70.7  Temp (24hrs), Avg:99.4 F (37.4 C), Min:99.4 F (37.4 C), Max:99.4 F (37.4 C)  Recent Labs  Lab 04/05/20 1107  WBC 13.0*  CREATININE 1.09    Estimated Creatinine Clearance: 85.2 mL/min (by C-G formula based on SCr of 1.09 mg/dL).    No Known Allergies  Antimicrobials this admission: 3/27 Zosyn >>  Dose adjustments this admission:   Microbiology results:   Thank you for allowing pharmacy to be a part of this patient's care.  4/27 PharmD, BCPS Clinical Pharmacist [facility] main pharmacy [phone] 04/05/2020 12:52 PM

## 2020-04-05 NOTE — ED Provider Notes (Signed)
[facility]-EMERGENCY DEPT Provider Note   CSN: [id number] Arrival date & time: 04/05/20  1749     History Chief Complaint  Patient presents with  . Abdominal Pain  . Groin Pain    Luke Frey is a 55 y.o. male.  HPI 55 year old male with no significant medical history presents to the ER with complaints of left lower quadrant pain which developed yesterday.  She reports that onset was gradual, but has progressed into his groin and radiating into his left thigh behind the knee.  Denies any nausea or vomiting.  He states that pain is worsened with movement, relieved if he can position himself in a certain way.  He was seen by his PCP and was referred to the ER for rule out of possible hernia incarceration, diverticulitis.  Patient reports prior abdominal surgical history including appendectomy.  Denies any fevers or chills.  No chest pain shortness of breath.  Denies any dysuria or hematuria, states he has had several soft bowel movements with no evidence of blood.    Past Medical History:  Diagnosis Date  . No pertinent past medical history     Patient Active Problem List   Diagnosis Date Noted  . Diverticulitis 04/05/2020  . Urinary frequency 04/05/2020  . Sepsis (HCC) 04/05/2020    Past Surgical History:  Procedure Laterality Date  . LAPAROSCOPIC APPENDECTOMY  10/28/2011   Procedure: APPENDECTOMY LAPAROSCOPIC;  Surgeon: Shelly Rubenstein, MD;  Location: [facility] OR;  Service: General;  Laterality: N/A;  . NO PAST SURGERIES         No family history on file.  Social History   Tobacco Use  . Smoking status: Never Smoker  . Smokeless tobacco: Never Used  Substance Use Topics  . Alcohol use: Yes  . Drug use: No    Home Medications Prior to Admission medications   Medication Sig Start Date End Date Taking? Authorizing Provider  aspirin 325 MG tablet Take 650 mg by mouth once as needed. pain    [provider]  HYDROcodone-acetaminophen  (NORCO/VICODIN) 5-325 MG per tablet Take 1-2 tablets by mouth every 4 (four) hours as needed. 10/29/11   Abigail Miyamoto, MD  metroNIDAZOLE (FLAGYL) 500 MG tablet Take 500 mg by mouth 3 (three) times daily. For 10 days  Finished 10/14    [provider]  naproxen sodium (ANAPROX) 550 MG tablet Take 550 mg by mouth 3 (three) times daily. For 4 days Finished 10/14    [provider]    Allergies    Patient has no known allergies.  Review of Systems   Review of Systems  Constitutional: Negative for chills and fever.  HENT: Negative for ear pain and sore throat.   Eyes: Negative for pain and visual disturbance.  Respiratory: Negative for cough and shortness of breath.   Cardiovascular: Negative for chest pain and palpitations.  Gastrointestinal: Positive for abdominal pain. Negative for vomiting.  Genitourinary: Negative for dysuria and hematuria.  Musculoskeletal: Negative for arthralgias and back pain.  Skin: Negative for color change and rash.  Neurological: Negative for seizures and syncope.  All other systems reviewed and are negative.   Physical Exam Updated Vital Signs BP (!) 148/100 (BP Location: Right Arm)   Pulse 92   Temp 99 F (37.2 C) (Oral)   Resp 14   Ht 5\' 9"  (1.753 m)   Wt 90.7 kg   SpO2 93%   BMI 29.53 kg/m   Physical Exam Constitutional:  Appearance: Normal appearance.  HENT:     Head: Normocephalic and atraumatic.  Eyes:     Extraocular Movements: Extraocular movements intact.     Conjunctiva/sclera: Conjunctivae normal.     Pupils: Pupils are equal, round, and reactive to light.  Cardiovascular:     Rate and Rhythm: Normal rate and regular rhythm.     Pulses: Normal pulses.     Heart sounds: Normal heart sounds.  Pulmonary:     Effort: Pulmonary effort is normal.     Breath sounds: Normal breath sounds.  Abdominal:     General: Abdomen is flat. Bowel sounds are normal. There is no distension.     Palpations: Abdomen is  soft. There is no mass.     Tenderness: There is abdominal tenderness in the right lower quadrant. There is left CVA tenderness. There is no right CVA tenderness, guarding or rebound.  Musculoskeletal:        General: Normal range of motion.     Cervical back: Normal range of motion. No tenderness.  Skin:    General: Skin is warm and dry.  Neurological:     General: No focal deficit present.     Mental Status: He is alert and oriented to person, place, and time.  Psychiatric:        Mood and Affect: Mood normal.        Behavior: Behavior normal.     ED Results / Procedures / Treatments   Labs (all labs ordered are listed, but only abnormal results are displayed) Labs Reviewed  COMPREHENSIVE METABOLIC PANEL - Abnormal; Notable for the following components:      Result Value   Glucose, Bld 119 (*)    Calcium 8.8 (*)    AST 14 (*)    All other components within normal limits  CBC - Abnormal; Notable for the following components:   WBC 13.0 (*)    All other components within normal limits  RESP PANEL BY RT-PCR (FLU A&B, COVID) ARPGX2  CULTURE, BLOOD (ROUTINE X 2)  CULTURE, BLOOD (ROUTINE X 2)  LIPASE, BLOOD  URINALYSIS, ROUTINE W REFLEX MICROSCOPIC  LACTIC ACID, PLASMA  LACTIC ACID, PLASMA  HIV ANTIBODY (ROUTINE TESTING W REFLEX)    EKG None  Radiology CT ABDOMEN PELVIS W CONTRAST  Result Date: 04/05/2020 CLINICAL DATA:  LEFT lower quadrant pain since yesterday with fever and change in bowel movements EXAM: CT ABDOMEN AND PELVIS WITH CONTRAST TECHNIQUE: Multidetector CT imaging of the abdomen and pelvis was performed using the standard protocol following bolus administration of intravenous contrast. Sagittal and coronal MPR images reconstructed from axial data set. CONTRAST:  OMNIPAQUE IOHEXOL 300 MG/ML SOLN IV. No oral contrast. COMPARISON:  None FINDINGS: Lower chest: Minimal dependent density/atelectasis at lung bases Hepatobiliary: Fatty infiltration of liver.  Subtle nodular foci within gallbladder question small gallstones versus polyps. Two small probable hepatic cysts. Pancreas: Normal appearance Spleen: Normal appearance Adrenals/Urinary Tract: LEFT renal cysts. Adrenal glands, kidneys, ureters, and bladder otherwise normal appearance. Stomach/Bowel: Prior appendectomy. Diverticulosis of descending and sigmoid colon with wall thickening and pericolic infiltrative changes at distal descending colon consistent with acute diverticulitis. Infiltration/edema extends into the proximal sigmoid mesocolon and lateral conal fascia as well as along the posterior pararenal fascia. Small focal collection of gas adjacent to colon 1.6 cm diameter consistent with contained local perforation. No discrete abscess. Stomach and remaining bowel loops unremarkable. Vascular/Lymphatic: Vascular structures patent.  No adenopathy. Reproductive: Prostate gland and seminal vesicles normal appearance Other: Small  LEFT inguinal hernia containing fat. No free air or free fluid. Musculoskeletal: No acute osseous findings. IMPRESSION: Acute diverticulitis of the distal descending colon with pericolic infiltrative changes extending into the proximal sigmoid mesocolon and lateral conal fascia as well as along the posterior pararenal fascia. Small focal collection of gas adjacent to colon consistent with contained perforation. No free intraperitoneal air or discrete abscess. Fatty infiltration of liver with small probable hepatic cysts. Small LEFT inguinal hernia containing fat. Findings called to Trudee Grip PA on 04/05/2020 at 1157 hours. Electronically Signed   By: Ulyses Southward M.D.   On: 04/05/2020 11:58    Procedures .Critical Care Performed by: Mare Ferrari, PA-C Authorized by: Mare Ferrari, PA-C   Critical care provider statement:    Critical care time (minutes):  45   Critical care was necessary to treat or prevent imminent or life-threatening deterioration of the following  conditions: perforated diverticulitis    Critical care was time spent personally by me on the following activities:  Discussions with consultants, evaluation of patient's response to treatment, examination of patient, ordering and performing treatments and interventions, ordering and review of laboratory studies, ordering and review of radiographic studies, pulse oximetry, re-evaluation of patient's condition, obtaining history from patient or surrogate and review of old charts     Medications Ordered in ED Medications  enoxaparin (LOVENOX) injection 40 mg (has no administration in time range)  sodium chloride flush (NS) 0.9 % injection 3 mL (has no administration in time range)  lactated ringers bolus 1,000 mL (has no administration in time range)    And  lactated ringers bolus 1,000 mL (has no administration in time range)    And  lactated ringers bolus 1,000 mL (has no administration in time range)  acetaminophen (TYLENOL) tablet 650 mg (has no administration in time range)    Or  acetaminophen (TYLENOL) suppository 650 mg (has no administration in time range)  HYDROcodone-acetaminophen (NORCO/VICODIN) 5-325 MG per tablet 1 tablet (has no administration in time range)  morphine 2 MG/ML injection 2 mg (has no administration in time range)  piperacillin-tazobactam (ZOSYN) IVPB 3.375 g (has no administration in time range)  morphine 4 MG/ML injection 4 mg (4 mg Intravenous Given 04/05/20 1112)  iohexol (OMNIPAQUE) 300 MG/ML solution 100 mL (100 mLs Intravenous Contrast Given 04/05/20 1134)  cefTRIAXone (ROCEPHIN) 2 g in sodium chloride 0.9 % 100 mL IVPB (0 g Intravenous Stopped 04/05/20 1255)  HYDROmorphone (DILAUDID) injection 1 mg (1 mg Intravenous Given 04/05/20 1221)    ED Course  I have reviewed the triage vital signs and the nursing notes.  Pertinent labs & imaging results that were available during my care of the patient were reviewed by me and considered in my medical decision making  (see chart for details).    MDM Rules/Calculators/A&P                          55 year old male presents to the ER with left lower quadrant pain x2 days.  On arrival, is well-appearing, no acute distress, resting comfortably in the ER bed.  Vitals overall reassuring.  Physical exam with left lower quadrant tenderness, as well as left CVA tenderness.  No significant visible hernia, no evidence of incarceration.  Labs and imaging ordered, reviewed and interpreted by me, CMP with no significant abnormalities, CBC with leukocytosis of 13.  Lipase normal.  Patient was given morphine for pain.  CT of the abdomen consistent with acute  diverticulitis with contained perforation.  Spoke with general surgery who recommends admission, they will follow.  Initiated Rocephin and Flagyl as per discussed with Dr. Estell HarpinZammit.  Spoke with Dr. Dairl PonderSegal will admit the patient for further evaluation and treatment.  Findings discussed with the patient, he is agreeable to admission.  Patient given additional Dilaudid for pain as he still has some mild symptoms.  Stable for admission at this time. Final Clinical Impression(s) / ED Diagnoses Final diagnoses:  None    Rx / DC Orders ED Discharge Orders    None       Leone BrandBelaya, Tila Millirons A, PA-C 04/05/20 1259    Bethann BerkshireZammit, Joseph, MD 04/07/20 [phone]

## 2020-04-05 NOTE — ED Triage Notes (Signed)
Patient here from home reporting left lower abd pain, radiating down into groin, down leg that started yesterday. States PCP referred for CT scan to rule out hernia. Denies n/v.

## 2020-04-05 NOTE — H&P (Signed)
History and Physical        Hospital Admission Note Date: 04/05/2020  Patient name: Luke Frey Medical record number: [id number] Date of birth: 1965-08-22 Age: 55 y.o. Gender: male  PCP: Creola Corn, MD    Chief Complaint    Chief Complaint  Patient presents with  . Abdominal Pain  . Groin Pain      HPI:   This is a 55 year old male with a past medical history of appendicitis s/p lap appendectomy in 2013 who presented to the ED with left lower quadrant pain with radiation to his left leg and groin which started yesterday morning.  Initially started as mild discomfort that he thought was gas however it did not relieve with OTC gas medication.  Was able to tolerate p.o. intake throughout the day.  Last night the pain became much more severe and kept him up throughout the night.  Had thin stools and fever of 101 F at home.  He took Excedrin at night which did not help.  Has a headache which she attributes to not sleeping last night.  Has not had any other medications today.  Currently states that the pain medication has helped so far.  Also, says that the pain radiates to his posterior left knee and has led to increased urinary frequency without dysuria.   ED Course: Afebrile, tachycardic, tachypneic,  on room air. Notable Labs: WBC 13.0, COVID-19 pending otherwise labs unremarkable. Notable Imaging: CT abdomen pelvis with contrast-acute diverticulitis of descending colon with pericolic infiltrative changes extending into the proximal sigmoid mesocolon and lateral conal fascia and along the posterior pararenal fascia, small focal collection of gas consistent with contained perforation, small left inguinal hernia. Patient received Dilaudid, morphine, ceftriaxone, metronidazole.  General surgery was consulted.    Vitals:   04/05/20 1249 04/05/20 1250  BP: (!) 148/100   Pulse: 98 92   Resp: 13 14  Temp:    SpO2: 96% 93%     Review of Systems:  Review of Systems  All other systems reviewed and are negative.   Medical/Social/Family History   Past Medical History: Past Medical History:  Diagnosis Date  . No pertinent past medical history     Past Surgical History:  Procedure Laterality Date  . LAPAROSCOPIC APPENDECTOMY  10/28/2011   Procedure: APPENDECTOMY LAPAROSCOPIC;  Surgeon: Shelly Rubenstein, MD;  Location: [facility] OR;  Service: General;  Laterality: N/A;  . NO PAST SURGERIES      Medications: Prior to Admission medications   Medication Sig Start Date End Date Taking? Authorizing Provider  aspirin 325 MG tablet Take 650 mg by mouth once as needed. pain    [provider]  HYDROcodone-acetaminophen (NORCO/VICODIN) 5-325 MG per tablet Take 1-2 tablets by mouth every 4 (four) hours as needed. 10/29/11   Abigail Miyamoto, MD  metroNIDAZOLE (FLAGYL) 500 MG tablet Take 500 mg by mouth 3 (three) times daily. For 10 days  Finished 10/14    [provider]  naproxen sodium (ANAPROX) 550 MG tablet Take 550 mg by mouth 3 (three) times daily. For 4 days Finished 10/14    [provider]    Allergies:  No Known Allergies  Social History:  reports that he  has never smoked. He has never used smokeless tobacco. He reports current alcohol use. He reports that he does not use drugs.  Family History: No family history on file.   Objective   Physical Exam: Blood pressure (!) 148/100, pulse 92, temperature 99.4 F (37.4 C), temperature source Oral, resp. rate 14, height 5\' 9"  (1.753 m), weight 90.7 kg, SpO2 93 %.  Physical Exam Vitals and nursing note reviewed.  Constitutional:      Appearance: Normal appearance.  HENT:     Head: Normocephalic and atraumatic.  Eyes:     Conjunctiva/sclera: Conjunctivae normal.  Cardiovascular:     Rate and Rhythm: Normal rate and regular rhythm.  Pulmonary:     Effort: Pulmonary effort is  normal.     Breath sounds: Normal breath sounds.  Abdominal:     General: Abdomen is flat.     Palpations: Abdomen is soft.     Tenderness: There is abdominal tenderness in the left lower quadrant.  Musculoskeletal:     Comments: LLE straight leg raise test reproduces LLQ pain  Skin:    Coloration: Skin is not jaundiced or pale.  Neurological:     Mental Status: He is alert. Mental status is at baseline.  Psychiatric:        Mood and Affect: Mood normal.        Behavior: Behavior normal.     LABS on Admission: I have personally reviewed all the labs and imaging below    Basic Metabolic Panel: Recent Labs  Lab 04/05/20 1107  NA 138  K 4.0  CL 105  CO2 23  GLUCOSE 119*  BUN 12  CREATININE 1.09  CALCIUM 8.8*   Liver Function Tests: Recent Labs  Lab 04/05/20 1107  AST 14*  ALT 17  ALKPHOS 58  BILITOT 1.2  PROT 7.7  ALBUMIN 4.3   Recent Labs  Lab 04/05/20 1107  LIPASE 27   No results for input(s): AMMONIA in the last 168 hours. CBC: Recent Labs  Lab 04/05/20 1107  WBC 13.0*  HGB 15.4  HCT 44.7  MCV 86.8  PLT 223   Cardiac Enzymes: No results for input(s): CKTOTAL, CKMB, CKMBINDEX, TROPONINI in the last 168 hours. BNP: Invalid input(s): POCBNP CBG: No results for input(s): GLUCAP in the last 168 hours.  Radiological Exams on Admission:  CT ABDOMEN PELVIS W CONTRAST  Result Date: 04/05/2020 CLINICAL DATA:  LEFT lower quadrant pain since yesterday with fever and change in bowel movements EXAM: CT ABDOMEN AND PELVIS WITH CONTRAST TECHNIQUE: Multidetector CT imaging of the abdomen and pelvis was performed using the standard protocol following bolus administration of intravenous contrast. Sagittal and coronal MPR images reconstructed from axial data set. CONTRAST:  04/07/2020 OMNIPAQUE IOHEXOL 300 MG/ML SOLN IV. No oral contrast. COMPARISON:  None FINDINGS: Lower chest: Minimal dependent density/atelectasis at lung bases Hepatobiliary: Fatty infiltration of  liver. Subtle nodular foci within gallbladder question small gallstones versus polyps. Two small probable hepatic cysts. Pancreas: Normal appearance Spleen: Normal appearance Adrenals/Urinary Tract: LEFT renal cysts. Adrenal glands, kidneys, ureters, and bladder otherwise normal appearance. Stomach/Bowel: Prior appendectomy. Diverticulosis of descending and sigmoid colon with wall thickening and pericolic infiltrative changes at distal descending colon consistent with acute diverticulitis. Infiltration/edema extends into the proximal sigmoid mesocolon and lateral conal fascia as well as along the posterior pararenal fascia. Small focal collection of gas adjacent to colon 1.6 cm diameter consistent with contained local perforation. No discrete abscess. Stomach and remaining bowel loops unremarkable. Vascular/Lymphatic: Vascular structures  patent.  No adenopathy. Reproductive: Prostate gland and seminal vesicles normal appearance Other: Small LEFT inguinal hernia containing fat. No free air or free fluid. Musculoskeletal: No acute osseous findings. IMPRESSION: Acute diverticulitis of the distal descending colon with pericolic infiltrative changes extending into the proximal sigmoid mesocolon and lateral conal fascia as well as along the posterior pararenal fascia. Small focal collection of gas adjacent to colon consistent with contained perforation. No free intraperitoneal air or discrete abscess. Fatty infiltration of liver with small probable hepatic cysts. Small LEFT inguinal hernia containing fat. Findings called to Trudee Grip PA on 04/05/2020 at 1157 hours. Electronically Signed   By: Ulyses Southward M.D.   On: 04/05/2020 11:58      EKG: Not done   A & P   Principal Problem:   Diverticulitis Active Problems:   Urinary frequency   Sepsis (HCC)   1. Sepsis without septic shock secondary to acute diverticulitis with contained perforation a. Sepsis criteria: Tachycardia, tachypnea,  leukocytosis b. Given antibiotics prior to blood cultures being obtained c. Radiation down left leg is likely referred pain d. Check lactic acid e. IV fluids per sepsis bundle protocol f. Zosyn g. N.p.o. h. General surgery consulted  2. Increased urinary frequency a. Likely reactive to intra-abdominal inflammation b. Follow-up UA    DVT prophylaxis: Lovenox   Code Status: Full Code  Diet: N.p.o. Family Communication: Admission, patients condition and plan of care including tests being ordered have been discussed with the patient who indicates understanding and agrees with the plan and Code Status. Patient's wife was updated  Disposition Plan: The appropriate patient status for this patient is INPATIENT. Inpatient status is judged to be reasonable and necessary in order to provide the required intensity of service to ensure the patient's safety. The patient's presenting symptoms, physical exam findings, and initial radiographic and laboratory data in the context of their chronic comorbidities is felt to place them at high risk for further clinical deterioration. Furthermore, it is not anticipated that the patient will be medically stable for discharge from the hospital within 2 midnights of admission. The following factors support the patient status of inpatient.   " The patient's presenting symptoms include LLQ pain. " The worrisome physical exam findings include LLQ pain. " The initial radiographic and laboratory data are worrisome because of diverticulitis with contained perforation. " The chronic co-morbidities include remote appendectomy.   * I certify that at the point of admission it is my clinical judgment that the patient will require inpatient hospital care spanning beyond 2 midnights from the point of admission due to high intensity of service, high risk for further deterioration and high frequency of surveillance required.*   Consultants  . General surgery  Procedures   . none  Time Spent on Admission: 62 minutes    Jae Dire, DO [facility]  04/05/2020, 12:52 PM

## 2020-04-06 ENCOUNTER — Encounter (HOSPITAL_COMMUNITY): Payer: Self-pay | Admitting: Internal Medicine

## 2020-04-06 DIAGNOSIS — R350 Frequency of micturition: Secondary | ICD-10-CM | POA: Diagnosis not present

## 2020-04-06 DIAGNOSIS — K5792 Diverticulitis of intestine, part unspecified, without perforation or abscess without bleeding: Secondary | ICD-10-CM | POA: Diagnosis not present

## 2020-04-06 NOTE — Progress Notes (Signed)
PROGRESS NOTE    Luke Frey  WUJ:[id number] DOB: May 18, 1965 DOA: 04/05/2020 PCP: Creola Corn, MD    Brief Narrative:  55 year old male with a past medical history of appendicitis s/p lap appendectomy in 2013 who presented to the ED with left lower quadrant pain, found to have acute diverticulitis with evidence of a contained perforation.  Assessment & Plan:   Principal Problem:   Diverticulitis Active Problems:   Urinary frequency   Sepsis (HCC)  1. Sepsis without septic shock secondary to acute diverticulitis with contained perforation a. Sepsis criteria: Tachycardia, tachypnea, leukocytosis b. Pt has been continued on zosyn c. General Surgery following with recommendation for continued IV abx and OK for clears today d. Pt reports feeling somewhat better today. Still has some L sided discomfort  2. Increased urinary frequency a. Likely reactive to intra-abdominal inflammation b. UA reviewed, neg  DVT prophylaxis: Lovenox subq Code Status: Full Family Communication: Pt in room, family is at bedside  Status is: Inpatient  Remains inpatient appropriate because:IV treatments appropriate due to intensity of illness or inability to take PO and Inpatient level of care appropriate due to severity of illness   Dispo: The patient is from: Home              Anticipated d/c is to: Home              Patient currently is not medically stable to d/c.   Difficult to place patient No       Consultants:   General Surgery  Procedures:     Antimicrobials: Anti-infectives (From admission, onward)   Start     Dose/Rate Route Frequency Ordered Stop   04/05/20 1400  piperacillin-tazobactam (ZOSYN) IVPB 3.375 g        3.375 g 12.5 mL/hr over 240 Minutes Intravenous Every 8 hours 04/05/20 1253     04/05/20 1200  cefTRIAXone (ROCEPHIN) 2 g in sodium chloride 0.9 % 100 mL IVPB       "And" Linked Group Details   2 g 200 mL/hr over 30 Minutes Intravenous  Once 04/05/20 1159  04/05/20 1255   04/05/20 1200  metroNIDAZOLE (FLAGYL) IVPB 500 mg  Status:  Discontinued       "And" Linked Group Details   500 mg 100 mL/hr over 60 Minutes Intravenous  Once 04/05/20 1200 04/05/20 1245       Subjective: Reports feeling somewhat better today. Eager to start eating  Objective: Vitals:   04/05/20 1840 04/05/20 2148 04/06/20 0140 04/06/20 0508  BP: (!) 146/92 (!) 142/88 (!) 159/87 (!) 152/85  Pulse: 95 93 95 92  Resp: 18 18 20 18   Temp: 99.5 F (37.5 C) 99.5 F (37.5 C) 100 F (37.8 C) 99.3 F (37.4 C)  TempSrc: Oral Oral Oral Oral  SpO2: 95% 96% 94% 95%  Weight:      Height:        Intake/Output Summary (Last 24 hours) at 04/06/2020 1336 Last data filed at 04/06/2020 04/08/2020 Gross per 24 hour  Intake 3218.78 ml  Output --  Net 3218.78 ml   Filed Weights   04/05/20 0915  Weight: 90.7 kg    Examination: General exam: Awake, laying in bed, in nad Respiratory system: Normal respiratory effort, no wheezing Cardiovascular system: regular rate, s1, s2 Gastrointestinal system: Soft, nondistended, positive BS Central nervous system: CN2-12 grossly intact, strength intact Extremities: Perfused, no clubbing Skin: Normal skin turgor, no notable skin lesions seen Psychiatry: Mood normal // no visual hallucinations   Data  Reviewed: I have personally reviewed following labs and imaging studies  CBC: Recent Labs  Lab 04/05/20 1107  WBC 13.0*  HGB 15.4  HCT 44.7  MCV 86.8  PLT 223   Basic Metabolic Panel: Recent Labs  Lab 04/05/20 1107  NA 138  K 4.0  CL 105  CO2 23  GLUCOSE 119*  BUN 12  CREATININE 1.09  CALCIUM 8.8*   GFR: Estimated Creatinine Clearance: 85.2 mL/min (by C-G formula based on SCr of 1.09 mg/dL). Liver Function Tests: Recent Labs  Lab 04/05/20 1107  AST 14*  ALT 17  ALKPHOS 58  BILITOT 1.2  PROT 7.7  ALBUMIN 4.3   Recent Labs  Lab 04/05/20 1107  LIPASE 27   No results for input(s): AMMONIA in the last 168  hours. Coagulation Profile: No results for input(s): INR, PROTIME in the last 168 hours. Cardiac Enzymes: No results for input(s): CKTOTAL, CKMB, CKMBINDEX, TROPONINI in the last 168 hours. BNP (last 3 results) No results for input(s): PROBNP in the last 8760 hours. HbA1C: No results for input(s): HGBA1C in the last 72 hours. CBG: No results for input(s): GLUCAP in the last 168 hours. Lipid Profile: No results for input(s): CHOL, HDL, LDLCALC, TRIG, CHOLHDL, LDLDIRECT in the last 72 hours. Thyroid Function Tests: No results for input(s): TSH, T4TOTAL, FREET4, T3FREE, THYROIDAB in the last 72 hours. Anemia Panel: No results for input(s): VITAMINB12, FOLATE, FERRITIN, TIBC, IRON, RETICCTPCT in the last 72 hours. Sepsis Labs: Recent Labs  Lab 04/05/20 1536  LATICACIDVEN 1.1    Recent Results (from the past 240 hour(s))  Resp Panel by RT-PCR (Flu A&B, Covid) Nasopharyngeal Swab     Status: None   Collection Time: 04/05/20 12:00 PM   Specimen: Nasopharyngeal Swab; Nasopharyngeal(NP) swabs in vial transport medium  Result Value Ref Range Status   SARS Coronavirus 2 by RT PCR NEGATIVE NEGATIVE Final    Comment: (NOTE) SARS-CoV-2 target nucleic acids are NOT DETECTED.  The SARS-CoV-2 RNA is generally detectable in upper respiratory specimens during the acute phase of infection. The lowest concentration of SARS-CoV-2 viral copies this assay can detect is 138 copies/mL. A negative result does not preclude SARS-Cov-2 infection and should not be used as the sole basis for treatment or other patient management decisions. A negative result may occur with  improper specimen collection/handling, submission of specimen other than nasopharyngeal swab, presence of viral mutation(s) within the areas targeted by this assay, and inadequate number of viral copies(<138 copies/mL). A negative result must be combined with clinical observations, patient history, and epidemiological information. The  expected result is Negative.  Fact Sheet for Patients:  BloggerCourse.com  Fact Sheet for Healthcare Providers:  SeriousBroker.it  This test is no t yet approved or cleared by the [place] FDA and  has been authorized for detection and/or diagnosis of SARS-CoV-2 by FDA under an Emergency Use Authorization (EUA). This EUA will remain  in effect (meaning this test can be used) for the duration of the COVID-19 declaration under Section 564(b)(1) of the Act, 21 U.S.C.section 360bbb-3(b)(1), unless the authorization is terminated  or revoked sooner.       Influenza A by PCR NEGATIVE NEGATIVE Final   Influenza B by PCR NEGATIVE NEGATIVE Final    Comment: (NOTE) The Xpert Xpress SARS-CoV-2/FLU/RSV plus assay is intended as an aid in the diagnosis of influenza from Nasopharyngeal swab specimens and should not be used as a sole basis for treatment. Nasal washings and aspirates are unacceptable for Xpert Xpress SARS-CoV-2/FLU/RSV  testing.  Fact Sheet for Patients: BloggerCourse.com  Fact Sheet for Healthcare Providers: SeriousBroker.it  This test is not yet approved or cleared by the [place] FDA and has been authorized for detection and/or diagnosis of SARS-CoV-2 by FDA under an Emergency Use Authorization (EUA). This EUA will remain in effect (meaning this test can be used) for the duration of the COVID-19 declaration under Section 564(b)(1) of the Act, 21 U.S.C. section 360bbb-3(b)(1), unless the authorization is terminated or revoked.  Performed at [facility], [address] [place], [place] [address]   Culture, blood (routine x 2)     Status: None (Preliminary result)   Collection Time: 04/05/20  2:54 PM   Specimen: BLOOD  Result Value Ref Range Status   Specimen Description   Final    BLOOD BLOOD RIGHT HAND Performed at [facility], [address] [place], [place] [address]    Special Requests   Final    BOTTLES DRAWN AEROBIC ONLY Blood Culture adequate volume Performed at [facility], [address], [place], [place] [address]    Culture   Final    NO GROWTH < 24 HOURS Performed at [facility], [address], [place], [place] [address]    Report Status PENDING  Incomplete  Culture, blood (routine x 2)     Status: None (Preliminary result)   Collection Time: 04/05/20  2:54 PM   Specimen: BLOOD  Result Value Ref Range Status   Specimen Description   Final    BLOOD RIGHT ANTECUBITAL Performed at [facility], [address], [place], [place] [address]    Special Requests   Final    BOTTLES DRAWN AEROBIC ONLY Blood Culture adequate volume Performed at [facility], [address], [place], [place] [address]    Culture   Final    NO GROWTH < 24 HOURS Performed at [facility], [address], [place], [place] [address]    Report Status PENDING  Incomplete     Radiology Studies: CT ABDOMEN PELVIS W CONTRAST  Result Date: 04/05/2020 CLINICAL DATA:  LEFT lower quadrant pain since yesterday with fever and change in bowel movements EXAM: CT ABDOMEN AND PELVIS WITH CONTRAST TECHNIQUE: Multidetector CT imaging of the abdomen and pelvis was performed using the standard protocol following bolus administration of intravenous contrast. Sagittal and coronal MPR images reconstructed from axial data set. CONTRAST:  OMNIPAQUE IOHEXOL 300 MG/ML SOLN IV. No oral contrast. COMPARISON:  None FINDINGS: Lower chest: Minimal dependent density/atelectasis at lung bases Hepatobiliary: Fatty infiltration of liver. Subtle nodular foci within gallbladder question small gallstones versus polyps. Two small probable hepatic cysts. Pancreas: Normal appearance Spleen: Normal appearance Adrenals/Urinary Tract: LEFT renal cysts. Adrenal glands, kidneys,  ureters, and bladder otherwise normal appearance. Stomach/Bowel: Prior appendectomy. Diverticulosis of descending and sigmoid colon with wall thickening and pericolic infiltrative changes at distal descending colon consistent with acute diverticulitis. Infiltration/edema extends into the proximal sigmoid mesocolon and lateral conal fascia as well as along the posterior pararenal fascia. Small focal collection of gas adjacent to colon 1.6 cm diameter consistent with contained local perforation. No discrete abscess. Stomach and remaining bowel loops unremarkable. Vascular/Lymphatic: Vascular structures patent.  No adenopathy. Reproductive: Prostate gland and seminal vesicles normal appearance Other: Small LEFT inguinal hernia containing fat. No free air or free fluid. Musculoskeletal: No acute osseous findings. IMPRESSION: Acute diverticulitis of the distal descending colon with pericolic infiltrative changes extending into  the proximal sigmoid mesocolon and lateral conal fascia as well as along the posterior pararenal fascia. Small focal collection of gas adjacent to colon consistent with contained perforation. No free intraperitoneal air or discrete abscess. Fatty infiltration of liver with small probable hepatic cysts. Small LEFT inguinal hernia containing fat. Findings called to Trudee GripMaria Belaya PA on 04/05/2020 at 1157 hours. Electronically Signed   By: Ulyses SouthwardMark  Boles M.D.   On: 04/05/2020 11:58    Scheduled Meds: . enoxaparin (LOVENOX) injection  40 mg Subcutaneous Q24H  . sodium chloride flush  3 mL Intravenous Q12H   Continuous Infusions: . sodium chloride 500 mL (04/05/20 2125)  . piperacillin-tazobactam (ZOSYN)  IV 3.375 g (04/06/20 0520)     LOS: 1 day   Rickey BarbaraStephen Anelia Carriveau, MD [facility] Pager On Amion  If 7PM-7AM, please contact night-coverage 04/06/2020, 1:36 PM

## 2020-04-07 DIAGNOSIS — K5792 Diverticulitis of intestine, part unspecified, without perforation or abscess without bleeding: Secondary | ICD-10-CM | POA: Diagnosis not present

## 2020-04-07 DIAGNOSIS — K5732 Diverticulitis of large intestine without perforation or abscess without bleeding: Secondary | ICD-10-CM | POA: Diagnosis not present

## 2020-04-07 DIAGNOSIS — K5780 Diverticulitis of intestine, part unspecified, with perforation and abscess without bleeding: Secondary | ICD-10-CM | POA: Diagnosis present

## 2020-04-07 LAB — COMPREHENSIVE METABOLIC PANEL
ALT: 13 U/L (ref 0–44)
AST: 12 U/L — ABNORMAL LOW (ref 15–41)
Albumin: 3.9 g/dL (ref 3.5–5.0)
Alkaline Phosphatase: 51 U/L (ref 38–126)
Anion gap: 12 (ref 5–15)
BUN: 13 mg/dL (ref 6–20)
CO2: 27 mmol/L (ref 22–32)
Calcium: 8.9 mg/dL (ref 8.9–10.3)
Chloride: 99 mmol/L (ref 98–111)
Creatinine, Ser: 1.31 mg/dL — ABNORMAL HIGH (ref 0.61–1.24)
GFR, Estimated: >60 mL/min (ref >60)
Glucose, Bld: 93 mg/dL (ref 70–99)
Potassium: 3.6 mmol/L (ref 3.5–5.1)
Sodium: 138 mmol/L (ref 135–145)
Total Bilirubin: 1.7 mg/dL — ABNORMAL HIGH (ref 0.3–1.2)
Total Protein: 7.5 g/dL (ref 6.5–8.1)

## 2020-04-07 LAB — CBC
HCT: 42.8 % (ref 39.0–52.0)
Hemoglobin: 14.5 g/dL (ref 13.0–17.0)
MCH: 29.9 pg (ref 26.0–34.0)
MCHC: 33.9 g/dL (ref 30.0–36.0)
MCV: 88.2 fL (ref 80.0–100.0)
Platelets: 228 10*3/uL (ref 150–400)
RBC: 4.85 MIL/uL (ref 4.22–5.81)
RDW: 12 % (ref 11.5–15.5)
WBC: 7.3 10*3/uL (ref 4.0–10.5)
nRBC: 0 % (ref 0.0–0.2)

## 2020-04-07 NOTE — Progress Notes (Signed)
[facility] Progress Note     Subjective: CC-  Feeling better today. Mild LLQ soreness but pain improved. Tolerating clear liquids. Denies n/v. BM this AM.  WBC 7.3, afebrile.  Objective: Vital signs in last 24 hours: Temp:  [98.3 F (36.8 C)-99.2 F (37.3 C)] 98.3 F (36.8 C) (03/29 0408) Pulse Rate:  [73-91] 73 (03/29 0408) Resp:  [18] 18 (03/29 0408) BP: (129-146)/(87-93) 129/87 (03/29 0408) SpO2:  [95 %-98 %] 98 % (03/29 0408) Last BM Date: 04/07/20  Intake/Output from previous day: 03/28 0701 - 03/29 0700 In: 333.9 [P.O.:236; I.V.:39.1; IV Piggyback:58.7] Out: -  Intake/Output this shift: No intake/output data recorded.  PE: Gen:  Alert, NAD, pleasant Pulm: rate and effort normal Abd: Soft, ND, mild subjective LLQ TTP without rebound or guarding Skin: no rashes noted, warm and dry  Lab Results:  Recent Labs    04/05/20 1107 04/07/20 0355  WBC 13.0* 7.3  HGB 15.4 14.5  HCT 44.7 42.8  PLT 223 228   BMET Recent Labs    04/05/20 1107 04/07/20 0355  NA 138 138  K 4.0 3.6  CL 105 99  CO2 23 27  GLUCOSE 119* 93  BUN 12 13  CREATININE 1.09 1.31*  CALCIUM 8.8* 8.9   PT/INR No results for input(s): LABPROT, INR in the last 72 hours. CMP     Component Value Date/Time   NA 138 04/07/2020 0355   K 3.6 04/07/2020 0355   CL 99 04/07/2020 0355   CO2 27 04/07/2020 0355   GLUCOSE 93 04/07/2020 0355   BUN 13 04/07/2020 0355   CREATININE 1.31 (H) 04/07/2020 0355   CALCIUM 8.9 04/07/2020 0355   PROT 7.5 04/07/2020 0355   ALBUMIN 3.9 04/07/2020 0355   AST 12 (L) 04/07/2020 0355   ALT 13 04/07/2020 0355   ALKPHOS 51 04/07/2020 0355   BILITOT 1.7 (H) 04/07/2020 0355   GFRNONAA >60 04/07/2020 0355   GFRAA >90 10/28/2011 1021   Lipase     Component Value Date/Time   LIPASE 27 04/05/2020 1107       Studies/Results: No results found.  Anti-infectives: Anti-infectives (From admission, onward)   Start     Dose/Rate Route Frequency  Ordered Stop   04/05/20 1400  piperacillin-tazobactam (ZOSYN) IVPB 3.375 g        3.375 g 12.5 mL/hr over 240 Minutes Intravenous Every 8 hours 04/05/20 1253     04/05/20 1200  cefTRIAXone (ROCEPHIN) 2 g in sodium chloride 0.9 % 100 mL IVPB       "And" Linked Group Details   2 g 200 mL/hr over 30 Minutes Intravenous  Once 04/05/20 1159 04/05/20 1255   04/05/20 1200  metroNIDAZOLE (FLAGYL) IVPB 500 mg  Status:  Discontinued       "And" Linked Group Details   500 mg 100 mL/hr over 60 Minutes Intravenous  Once 04/05/20 1200 04/05/20 1245       Assessment/Plan Left inguinal hernia - fat containing  Diverticulitis of the descending colon with a small focal collection of extraluminal gas without abscess - 1st bout - last colonoscopy ~2 years ago by Dr. Loreta Ave - CT 3/27 acute diverticulitis of the distal descending colon with pericolic infiltrative changes extending into the proximal sigmoid mesocolon and lateral conal fascia as well as along the posterior pararenal fascia; small focal collection of gas adjacent to colon consistent with contained perforation; no free intraperitoneal air or discrete abscess  ID - zosyn 3/27>>day#3 FEN - FLD>>soft diet VTE -  lovenox Foley - none Follow up - GI (Dr. Loreta Ave)  Plan - WBC normalized, pain improved, tolerating liquids. Advance to full liquids, soft diet for dinner if doing well. Continue IV antibiotics. May be able to transition to PO antibiotics and discharge home tomorrow if doing well.    LOS: 2 days    Franne Forts, [facility] 04/07/2020, 12:04 PM Please see Amion for pager number during day hours 7:00am-4:30pm

## 2020-04-07 NOTE — Progress Notes (Addendum)
PROGRESS NOTE    Luke Frey  UUV:[id number] DOB: 1965-04-06 DOA: 04/05/2020 PCP: Creola Corn, MD    Brief Narrative:  55 year old male with a past medical history of appendicitis s/p lap appendectomy in 2013 who presented to the ED with left lower quadrant pain, found to have acute diverticulitis with evidence of a contained perforation.  Assessment & Plan:   Principal Problem:   Diverticulitis Active Problems:   Urinary frequency   Sepsis (HCC)  1. Sepsis without septic shock secondary to acute diverticulitis with contained perforation a. Sepsis criteria: Tachycardia, tachypnea, leukocytosis b. Pt has been continued on zosyn, will continue IV abx for now c. General Surgery following, recommendation to begin advancing diet as tolerated  2. Increased urinary frequency a. Likely reactive to intra-abdominal inflammation b. UA reviewed, neg  DVT prophylaxis: Lovenox subq Code Status: Full Family Communication: Pt in room, family is at bedside  Status [id number]  Will transition to Obs status as pt will likely d/c in <24hrs   Dispo: The patient is from: Home              Anticipated d/c is to: Home              Patient currently is not medically stable to d/c.   Difficult to place patient No       Consultants:   General Surgery  Procedures:     Antimicrobials: Anti-infectives (From admission, onward)   Start     Dose/Rate Route Frequency Ordered Stop   04/05/20 1400  piperacillin-tazobactam (ZOSYN) IVPB 3.375 g        3.375 g 12.5 mL/hr over 240 Minutes Intravenous Every 8 hours 04/05/20 1253     04/05/20 1200  cefTRIAXone (ROCEPHIN) 2 g in sodium chloride 0.9 % 100 mL IVPB       "And" Linked Group Details   2 g 200 mL/hr over 30 Minutes Intravenous  Once 04/05/20 1159 04/05/20 1255   04/05/20 1200  metroNIDAZOLE (FLAGYL) IVPB 500 mg  Status:  Discontinued       "And" Linked Group Details   500 mg 100 mL/hr over 60 Minutes Intravenous  Once 04/05/20  1200 04/05/20 1245      Subjective: Reports continued LLQ abd discomfort, somewhat improved today  Objective: Vitals:   04/06/20 0140 04/06/20 0508 04/06/20 2001 04/07/20 0408  BP: (!) 159/87 (!) 152/85 (!) 146/93 129/87  Pulse: 95 92 91 73  Resp: 20 18 18 18   Temp: 100 F (37.8 C) 99.3 F (37.4 C) 99.2 F (37.3 C) 98.3 F (36.8 C)  TempSrc: Oral Oral Oral   SpO2: 94% 95% 95% 98%  Weight:      Height:        Intake/Output Summary (Last 24 hours) at 04/07/2020 1532 Last data filed at 04/06/2020 1641 Gross per 24 hour  Intake 236 ml  Output --  Net 236 ml   Filed Weights   04/05/20 0915  Weight: 90.7 kg    Examination: General exam: Conversant, in no acute distress Respiratory system: normal chest rise, clear, no audible wheezing Cardiovascular system: regular rhythm, s1-s2 Gastrointestinal system: Nondistended, nontender, pos BS Central nervous system: No seizures, no tremors Extremities: No cyanosis, no joint deformities Skin: No rashes, no pallor Psychiatry: Affect normal // no auditory hallucinations    Data Reviewed: I have personally reviewed following labs and imaging studies  CBC: Recent Labs  Lab 04/05/20 1107 04/07/20 0355  WBC 13.0* 7.3  HGB 15.4 14.5  HCT 44.7 42.8  MCV 86.8 88.2  PLT 223 228   Basic Metabolic Panel: Recent Labs  Lab 04/05/20 1107 04/07/20 0355  NA 138 138  K 4.0 3.6  CL 105 99  CO2 23 27  GLUCOSE 119* 93  BUN 12 13  CREATININE 1.09 1.31*  CALCIUM 8.8* 8.9   GFR: Estimated Creatinine Clearance: 70.9 mL/min (A) (by C-G formula based on SCr of 1.31 mg/dL (H)). Liver Function Tests: Recent Labs  Lab 04/05/20 1107 04/07/20 0355  AST 14* 12*  ALT 17 13  ALKPHOS 58 51  BILITOT 1.2 1.7*  PROT 7.7 7.5  ALBUMIN 4.3 3.9   Recent Labs  Lab 04/05/20 1107  LIPASE 27   No results for input(s): AMMONIA in the last 168 hours. Coagulation Profile: No results for input(s): INR, PROTIME in the last 168  hours. Cardiac Enzymes: No results for input(s): CKTOTAL, CKMB, CKMBINDEX, TROPONINI in the last 168 hours. BNP (last 3 results) No results for input(s): PROBNP in the last 8760 hours. HbA1C: No results for input(s): HGBA1C in the last 72 hours. CBG: No results for input(s): GLUCAP in the last 168 hours. Lipid Profile: No results for input(s): CHOL, HDL, LDLCALC, TRIG, CHOLHDL, LDLDIRECT in the last 72 hours. Thyroid Function Tests: No results for input(s): TSH, T4TOTAL, FREET4, T3FREE, THYROIDAB in the last 72 hours. Anemia Panel: No results for input(s): VITAMINB12, FOLATE, FERRITIN, TIBC, IRON, RETICCTPCT in the last 72 hours. Sepsis Labs: Recent Labs  Lab 04/05/20 1536  LATICACIDVEN 1.1    Recent Results (from the past 240 hour(s))  Resp Panel by RT-PCR (Flu A&B, Covid) Nasopharyngeal Swab     Status: None   Collection Time: 04/05/20 12:00 PM   Specimen: Nasopharyngeal Swab; Nasopharyngeal(NP) swabs in vial transport medium  Result Value Ref Range Status   SARS Coronavirus 2 by RT PCR NEGATIVE NEGATIVE Final    Comment: (NOTE) SARS-CoV-2 target nucleic acids are NOT DETECTED.  The SARS-CoV-2 RNA is generally detectable in upper respiratory specimens during the acute phase of infection. The lowest concentration of SARS-CoV-2 viral copies this assay can detect is 138 copies/mL. A negative result does not preclude SARS-Cov-2 infection and should not be used as the sole basis for treatment or other patient management decisions. A negative result may occur with  improper specimen collection/handling, submission of specimen other than nasopharyngeal swab, presence of viral mutation(s) within the areas targeted by this assay, and inadequate number of viral copies(<138 copies/mL). A negative result must be combined with clinical observations, patient history, and epidemiological information. The expected result is Negative.  Fact Sheet for Patients:   BloggerCourse.com  Fact Sheet for Healthcare Providers:  SeriousBroker.it  This test is no t yet approved or cleared by the [place] FDA and  has been authorized for detection and/or diagnosis of SARS-CoV-2 by FDA under an Emergency Use Authorization (EUA). This EUA will remain  in effect (meaning this test can be used) for the duration of the COVID-19 declaration under Section 564(b)(1) of the Act, 21 U.S.C.section 360bbb-3(b)(1), unless the authorization is terminated  or revoked sooner.       Influenza A by PCR NEGATIVE NEGATIVE Final   Influenza B by PCR NEGATIVE NEGATIVE Final    Comment: (NOTE) The Xpert Xpress SARS-CoV-2/FLU/RSV plus assay is intended as an aid in the diagnosis of influenza from Nasopharyngeal swab specimens and should not be used as a sole basis for treatment. Nasal washings and aspirates are unacceptable for Xpert Xpress SARS-CoV-2/FLU/RSV testing.  Fact Sheet for Patients:  BloggerCourse.com  Fact Sheet for Healthcare Providers: SeriousBroker.it  This test is not yet approved or cleared by the [place] FDA and has been authorized for detection and/or diagnosis of SARS-CoV-2 by FDA under an Emergency Use Authorization (EUA). This EUA will remain in effect (meaning this test can be used) for the duration of the COVID-19 declaration under Section 564(b)(1) of the Act, 21 U.S.C. section 360bbb-3(b)(1), unless the authorization is terminated or revoked.  Performed at [facility], [address], [place], [place] [address]   Culture, blood (routine x 2)     Status: None (Preliminary result)   Collection Time: 04/05/20  2:54 PM   Specimen: BLOOD  Result Value Ref Range Status   Specimen Description   Final    BLOOD BLOOD RIGHT HAND Performed at [facility], [address], [place], [place] [address]     Special Requests   Final    BOTTLES DRAWN AEROBIC ONLY Blood Culture adequate volume Performed at [facility], [address], [place], [place] [address]    Culture   Final    NO GROWTH 2 DAYS Performed at [facility], [address], [place], [place] [address]    Report Status PENDING  Incomplete  Culture, blood (routine x 2)     Status: None (Preliminary result)   Collection Time: 04/05/20  2:54 PM   Specimen: BLOOD  Result Value Ref Range Status   Specimen Description   Final    BLOOD RIGHT ANTECUBITAL Performed at [facility], [address] [place], [place] [address]    Special Requests   Final    BOTTLES DRAWN AEROBIC ONLY Blood Culture adequate volume Performed at [facility], [address] [place], [place] [address]    Culture   Final    NO GROWTH 2 DAYS Performed at [facility], [address] [place], [place] [address]    Report Status PENDING  Incomplete     Radiology Studies: No results found.  Scheduled Meds: . enoxaparin (LOVENOX) injection  40 mg Subcutaneous Q24H  . sodium chloride flush  3 mL Intravenous Q12H   Continuous Infusions: . sodium chloride 500 mL (04/05/20 2125)  . piperacillin-tazobactam (ZOSYN)  IV 3.375 g (04/07/20 1441)     LOS: 2 days   Rickey Barbara, MD [facility] Pager On Amion  If 7PM-7AM, please contact night-coverage 04/07/2020, 3:32 PM

## 2020-04-08 DIAGNOSIS — R350 Frequency of micturition: Secondary | ICD-10-CM | POA: Diagnosis not present

## 2020-04-08 DIAGNOSIS — K5780 Diverticulitis of intestine, part unspecified, with perforation and abscess without bleeding: Secondary | ICD-10-CM | POA: Diagnosis not present

## 2020-04-08 DIAGNOSIS — A419 Sepsis, unspecified organism: Secondary | ICD-10-CM | POA: Diagnosis not present

## 2020-04-08 DIAGNOSIS — K5792 Diverticulitis of intestine, part unspecified, without perforation or abscess without bleeding: Secondary | ICD-10-CM | POA: Diagnosis not present

## 2020-04-08 LAB — CBC
HCT: 42.8 % (ref 39.0–52.0)
Hemoglobin: 14.4 g/dL (ref 13.0–17.0)
MCH: 29.4 pg (ref 26.0–34.0)
MCHC: 33.6 g/dL (ref 30.0–36.0)
MCV: 87.5 fL (ref 80.0–100.0)
Platelets: 270 10*3/uL (ref 150–400)
RBC: 4.89 MIL/uL (ref 4.22–5.81)
RDW: 11.9 % (ref 11.5–15.5)
WBC: 6.9 10*3/uL (ref 4.0–10.5)
nRBC: 0 % (ref 0.0–0.2)

## 2020-04-08 LAB — COMPREHENSIVE METABOLIC PANEL
ALT: 14 U/L (ref 0–44)
AST: 15 U/L (ref 15–41)
Albumin: 3.8 g/dL (ref 3.5–5.0)
Alkaline Phosphatase: 47 U/L (ref 38–126)
Anion gap: 12 (ref 5–15)
BUN: 12 mg/dL (ref 6–20)
CO2: 27 mmol/L (ref 22–32)
Calcium: 9.2 mg/dL (ref 8.9–10.3)
Chloride: 101 mmol/L (ref 98–111)
Creatinine, Ser: 1.24 mg/dL (ref 0.61–1.24)
GFR, Estimated: >60 mL/min (ref >60)
Glucose, Bld: 97 mg/dL (ref 70–99)
Potassium: 3.8 mmol/L (ref 3.5–5.1)
Sodium: 140 mmol/L (ref 135–145)
Total Bilirubin: 1.2 mg/dL (ref 0.3–1.2)
Total Protein: 7.6 g/dL (ref 6.5–8.1)

## 2020-04-08 MED ORDER — AMOXICILLIN-POT CLAVULANATE 875-125 MG PO TABS: 1.0000 | ORAL_TABLET | Freq: Two times a day (BID) | ORAL | 0 refills | Status: AC

## 2020-04-08 MED ORDER — SACCHAROMYCES BOULARDII 250 MG PO CAPS
250.0000 mg | ORAL_CAPSULE | Freq: Two times a day (BID) | ORAL | Status: DC
  Administered 2020-04-08: 250 mg via ORAL
  Filled 2020-04-08: qty 1

## 2020-04-08 NOTE — Discharge Summary (Signed)
Physician Discharge Summary  Luke Frey XLK:[id number] DOB: 12/20/1965 DOA: 04/05/2020  PCP: Luke Corn, MD  Admit date: 04/05/2020 Discharge date: 04/08/2020  Admitted From: Home Disposition: Home  Recommendations for Outpatient Follow-up:  1. Follow ups as below. 2. Please obtain CBC/BMP/Mag at follow up 3. Please follow up on the following pending results: None  Home Health: None required Equipment/Devices: None required  Discharge Condition: Stable CODE STATUS: Full code   Follow-up Information    Luke Elizabeth, MD. Schedule an appointment as soon as possible for a visit in 3 week(s).   Specialty: Gastroenterology Why: f/u diverticulitis  Contact information: [address], [place] [address] [phone]        Luke Corn, MD. Schedule an appointment as soon as possible for a visit in 1 week(s).   Specialty: Internal Medicine Contact information: [address] [place] [address] [phone]                Hospital Course: 55 year old M with PMH of appendicitis status post lap appendectomy in 2013 presented to ED with LLQ pain and found to have diverticulitis with evidence of contained perforation, and admitted for sepsis due to acute diverticulitis.  General surgery consulted.  He was treated with IV Zosyn.  Eventually improved and tolerated soft diet.  Cleared for discharge by general surgery for outpatient follow-up.  He is discharged on Augmentin for additional 8 days (total of 10 days treatment).  Was given dietary and follow-up instruction including follow-up with GI  See individual problem list below for more on hospital course.  Discharge Diagnoses:  Sepsis due to acute diverticulitis with contained perforation: Treated with IV Zosyn and discharged on p.o. Augmentin for a total of 10 days.  Tolerated soft diet on the day of discharge. -Outpatient follow-up with PCP, general surgery and GI as above  Increased urinary  frequency: Likely reactive to the above.  Urinalysis not concerning for UTI.  Overweight Body mass index is 29.53 kg/m.  -Encourage lifestyle change to lose weight.      Family communication: Updated patient's wife at bedside.    Discharge Exam: Vitals:   04/07/20 2023 04/08/20 0448  BP: (!) 134/92 139/88  Pulse: 79 71  Resp: 18 18  Temp: 99.2 F (37.3 C) 97.9 F (36.6 C)  SpO2: 95% 99%    GENERAL: No apparent distress.  Nontoxic. HEENT: MMM.  Vision and hearing grossly intact.  NECK: Supple.  No apparent JVD.  RESP:  No IWOB.  Fair aeration bilaterally. CVS:  RRR. Heart sounds normal.  ABD/GI/GU: Bowel sounds present. Soft. Non tender.  MSK/EXT:  Moves extremities. No apparent deformity. No edema.  SKIN: no apparent skin lesion or wound NEURO: Awake, alert and oriented appropriately.  No apparent focal neuro deficit. PSYCH: Calm. Normal affect.  Discharge Instructions  Discharge Instructions    Diet general   Complete by: As directed    Soft diet   Discharge instructions   Complete by: As directed    It has been a pleasure taking care of you!  You were hospitalized with abdominal pain due to diverticulitis (see separate discharge instruction for more).  You were treated with IV antibiotics and your symptoms improved.  We are discharging you on oral antibiotics to complete treatment course.  Is very important that you take your antibiotics until you complete the whole course.  You may continue soft diet for the next few days and advance to regular diet as tolerated.  We recommend  follow-up with gastroenterology for colonoscopy in about 3 to 4 weeks once the infection is fully treated.  Recommend getting diet rich in fiber or using a stool softener to avoid constipation.  Please review your new medication list and the directions on your medications before you take them.   Take care,   Increase activity slowly   Complete by: As directed      Allergies as of  04/08/2020   No Known Allergies     Medication List    STOP taking these medications   aspirin-acetaminophen-caffeine 250-250-65 MG tablet Commonly known as: EXCEDRIN MIGRAINE     TAKE these medications   amoxicillin-clavulanate 875-125 MG tablet Commonly known as: Augmentin Take 1 tablet by mouth 2 (two) times daily for 8 days.   simethicone 80 MG chewable tablet Commonly known as: MYLICON Chew 80 mg by mouth every 6 (six) hours as needed for flatulence.       Consultations:  General surgery  Procedures/Studies:   CT ABDOMEN PELVIS W CONTRAST  Result Date: 04/05/2020 CLINICAL DATA:  LEFT lower quadrant pain since yesterday with fever and change in bowel movements EXAM: CT ABDOMEN AND PELVIS WITH CONTRAST TECHNIQUE: Multidetector CT imaging of the abdomen and pelvis was performed using the standard protocol following bolus administration of intravenous contrast. Sagittal and coronal MPR images reconstructed from axial data set. CONTRAST:  100mL OMNIPAQUE IOHEXOL 300 MG/ML SOLN IV. No oral contrast. COMPARISON:  None FINDINGS: Lower chest: Minimal dependent density/atelectasis at lung bases Hepatobiliary: Fatty infiltration of liver. Subtle nodular foci within gallbladder question small gallstones versus polyps. Two small probable hepatic cysts. Pancreas: Normal appearance Spleen: Normal appearance Adrenals/Urinary Tract: LEFT renal cysts. Adrenal glands, kidneys, ureters, and bladder otherwise normal appearance. Stomach/Bowel: Prior appendectomy. Diverticulosis of descending and sigmoid colon with wall thickening and pericolic infiltrative changes at distal descending colon consistent with acute diverticulitis. Infiltration/edema extends into the proximal sigmoid mesocolon and lateral conal fascia as well as along the posterior pararenal fascia. Small focal collection of gas adjacent to colon 1.6 cm diameter consistent with contained local perforation. No discrete abscess. Stomach and  remaining bowel loops unremarkable. Vascular/Lymphatic: Vascular structures patent.  No adenopathy. Reproductive: Prostate gland and seminal vesicles normal appearance Other: Small LEFT inguinal hernia containing fat. No free air or free fluid. Musculoskeletal: No acute osseous findings. IMPRESSION: Acute diverticulitis of the distal descending colon with pericolic infiltrative changes extending into the proximal sigmoid mesocolon and lateral conal fascia as well as along the posterior pararenal fascia. Small focal collection of gas adjacent to colon consistent with contained perforation. No free intraperitoneal air or discrete abscess. Fatty infiltration of liver with small probable hepatic cysts. Small LEFT inguinal hernia containing fat. Findings called to Trudee GripMaria Belaya PA on 04/05/2020 at 1157 hours. Electronically Signed   By: Ulyses SouthwardMark  Boles M.D.   On: 04/05/2020 11:58        The results of significant diagnostics from this hospitalization (including imaging, microbiology, ancillary and laboratory) are listed below for reference.     Microbiology: Recent Results (from the past 240 hour(s))  Resp Panel by RT-PCR (Flu A&B, Covid) Nasopharyngeal Swab     Status: None   Collection Time: 04/05/20 12:00 PM   Specimen: Nasopharyngeal Swab; Nasopharyngeal(NP) swabs in vial transport medium  Result Value Ref Range Status   SARS Coronavirus 2 by RT PCR NEGATIVE NEGATIVE Final    Comment: (NOTE) SARS-CoV-2 target nucleic acids are NOT DETECTED.  The SARS-CoV-2 RNA is generally detectable in upper respiratory specimens  during the acute phase of infection. The lowest concentration of SARS-CoV-2 viral copies this assay can detect is 138 copies/mL. A negative result does not preclude SARS-Cov-2 infection and should not be used as the sole basis for treatment or other patient management decisions. A negative result may occur with  improper specimen collection/handling, submission of specimen other than  nasopharyngeal swab, presence of viral mutation(s) within the areas targeted by this assay, and inadequate number of viral copies(<138 copies/mL). A negative result must be combined with clinical observations, patient history, and epidemiological information. The expected result is Negative.  Fact Sheet for Patients:  BloggerCourse.com  Fact Sheet for Healthcare Providers:  SeriousBroker.it  This test is no t yet approved or cleared by the [place] FDA and  has been authorized for detection and/or diagnosis of SARS-CoV-2 by FDA under an Emergency Use Authorization (EUA). This EUA will remain  in effect (meaning this test can be used) for the duration of the COVID-19 declaration under Section 564(b)(1) of the Act, 21 U.S.C.section 360bbb-3(b)(1), unless the authorization is terminated  or revoked sooner.       Influenza A by PCR NEGATIVE NEGATIVE Final   Influenza B by PCR NEGATIVE NEGATIVE Final    Comment: (NOTE) The Xpert Xpress SARS-CoV-2/FLU/RSV plus assay is intended as an aid in the diagnosis of influenza from Nasopharyngeal swab specimens and should not be used as a sole basis for treatment. Nasal washings and aspirates are unacceptable for Xpert Xpress SARS-CoV-2/FLU/RSV testing.  Fact Sheet for Patients: BloggerCourse.com  Fact Sheet for Healthcare Providers: SeriousBroker.it  This test is not yet approved or cleared by the [place] FDA and has been authorized for detection and/or diagnosis of SARS-CoV-2 by FDA under an Emergency Use Authorization (EUA). This EUA will remain in effect (meaning this test can be used) for the duration of the COVID-19 declaration under Section 564(b)(1) of the Act, 21 U.S.C. section 360bbb-3(b)(1), unless the authorization is terminated or revoked.  Performed at [facility], [address] [place], [place] [address]   Culture, blood (routine x 2)     Status: None (Preliminary result)   Collection Time: 04/05/20  2:54 PM   Specimen: BLOOD  Result Value Ref Range Status   Specimen Description   Final    BLOOD BLOOD RIGHT HAND Performed at [facility], [address], [place], [place] [address]    Special Requests   Final    BOTTLES DRAWN AEROBIC ONLY Blood Culture adequate volume Performed at [facility], [address], [place], [place] [address]    Culture   Final    NO GROWTH 3 DAYS Performed at [facility], [address] [place], [place] [address]    Report Status PENDING  Incomplete  Culture, blood (routine x 2)     Status: None (Preliminary result)   Collection Time: 04/05/20  2:54 PM   Specimen: BLOOD  Result Value Ref Range Status   Specimen Description   Final    BLOOD RIGHT ANTECUBITAL Performed at [facility], [address] [place], [place] [address]    Special Requests   Final    BOTTLES DRAWN AEROBIC ONLY Blood Culture adequate volume Performed at [facility], [address], [place], [place] [address]    Culture   Final    NO GROWTH 3 DAYS Performed at [facility], [address], [place], [place] [address]    Report Status  PENDING  Incomplete     Labs:  CBC: Recent Labs  Lab 04/05/20 1107 04/07/20 0355 04/08/20 0358  WBC 13.0* 7.3 6.9  HGB 15.4 14.5 14.4  HCT 44.7 42.8 42.8  MCV 86.8 88.2 87.5  PLT 223 228 270   BMP &GFR Recent Labs  Lab 04/05/20 1107 04/07/20 0355 04/08/20 0358  NA 138 138 140  K 4.0 3.6 3.8  CL 105 99 101  CO2 23 27 27   GLUCOSE 119* 93 97  BUN 12 13 12   CREATININE 1.09 1.31* 1.24  CALCIUM 8.8* 8.9 9.2   Estimated Creatinine Clearance: 74.9 mL/min (by C-G formula based on SCr of 1.24 mg/dL). Liver & Pancreas: Recent Labs  Lab 04/05/20 1107 04/07/20 0355 04/08/20 0358  AST 14* 12* 15  ALT 17 13 14    ALKPHOS 58 51 47  BILITOT 1.2 1.7* 1.2  PROT 7.7 7.5 7.6  ALBUMIN 4.3 3.9 3.8   Recent Labs  Lab 04/05/20 1107  LIPASE 27   No results for input(s): AMMONIA in the last 168 hours. Diabetic: No results for input(s): HGBA1C in the last 72 hours. No results for input(s): GLUCAP in the last 168 hours. Cardiac Enzymes: No results for input(s): CKTOTAL, CKMB, CKMBINDEX, TROPONINI in the last 168 hours. No results for input(s): PROBNP in the last 8760 hours. Coagulation Profile: No results for input(s): INR, PROTIME in the last 168 hours. Thyroid Function Tests: No results for input(s): TSH, T4TOTAL, FREET4, T3FREE, THYROIDAB in the last 72 hours. Lipid Profile: No results for input(s): CHOL, HDL, LDLCALC, TRIG, CHOLHDL, LDLDIRECT in the last 72 hours. Anemia Panel: No results for input(s): VITAMINB12, FOLATE, FERRITIN, TIBC, IRON, RETICCTPCT in the last 72 hours. Urine analysis:    Component Value Date/Time   COLORURINE YELLOW 04/05/2020 1700   APPEARANCEUR CLEAR 04/05/2020 1700   LABSPEC >1.046 (H) 04/05/2020 1700   PHURINE 5.0 04/05/2020 1700   GLUCOSEU NEGATIVE 04/05/2020 1700   HGBUR SMALL (A) 04/05/2020 1700   BILIRUBINUR NEGATIVE 04/05/2020 1700   KETONESUR 20 (A) 04/05/2020 1700   PROTEINUR NEGATIVE 04/05/2020 1700   UROBILINOGEN 0.2 10/28/2011 0901   NITRITE NEGATIVE 04/05/2020 1700   LEUKOCYTESUR NEGATIVE 04/05/2020 1700   Sepsis Labs: Invalid input(s): PROCALCITONIN, LACTICIDVEN   Time coordinating discharge: 35 minutes  SIGNED:  10/30/2011, MD  [facility] 04/08/2020, 11:30 AM  If 7PM-7AM, please contact night-coverage [email or web address]

## 2020-04-08 NOTE — Discharge Instructions (Addendum)
Recommend low fiber diet initially. Once off antibiotics and feeling back to normal recommend resuming high fiber diet Follow up with Dr. Loreta Ave 3-4 weeks after discharge   Low-Fiber Eating Plan Fiber is found in fruits, vegetables, whole grains, and beans. Eating a diet low in fiber helps to reduce how often you have bowel movements and the amount of stool you produce. A low-fiber eating plan may help your digestive system heal if you:  Have certain conditions, such as Crohn's disease, diverticulitis, or irritable bowel syndrome (IBS), and are having a flare-up.  Recently had radiation therapy on your pelvis or bowel.  Recently had intestinal surgery.  Have a new surgical opening in your abdomen (colostomy or ileostomy).  Have an intestine that has narrowed (stricture). Your health care provider will tell you how long to stay on this diet and may recommend that you work with a dietitian. What are tips for following this plan? Reading food labels  Check the nutrition facts label on food products for the amount of dietary fiber.  Choose foods that have less than 2 grams (g) of fiber per serving.   Cooking  Use white flour for baking and cooking.  Cook meat using methods that keep it tender, such as braising or poaching.  Cook eggs until the yolk is completely solid.  Cook with healthy oils, such as olive oil or canola oil. Meal planning  Eat 5-6 small meals throughout the day instead of 3 large meals.  If you are lactose intolerant: ? Choose low-lactose dairy foods. ? Do not eat dairy foods if told by your health care provider or dietitian.  Limit fats and oils to less than 8 teaspoons (39 mL) a day.  Eat small portions of desserts.  Limit acidic, spicy, or fried foods to reduce gas, bloating, and discomfort. General information  Follow instructions from your health care provider or dietitian about how much fiber you should have each day.  Most people on a low-fiber  eating plan should eat less than 10 g of fiber a day. Your daily fiber goal is _________________ g.  Take vitamin and mineral supplements as told by your health care provider or dietitian. Chewable or liquid forms are best when on this eating plan. A gummy vitamin is not recommended. What foods should I eat? Fruits Soft-cooked or canned fruits without skin and seeds. Ripe banana. Applesauce. Fruit juice without pulp. Vegetables Well-cooked or canned vegetables without skin, seeds, or stems. Cooked potatoes without skins. Vegetable juice. Grains All bread and crackers made with white flour. Waffles, pancakes, and [place] toast. Bagels. Pretzels. Melba toast, zwieback, and matzoh. Cooked and dried cereals that do not have whole grains, added fiber, seeds, or dried fruit. Denzil Magnuson. Hot and cold cereals made with refined corn, rice, or oats. Plain pasta and noodles. White rice. Meats and other proteins Ground meat. Tender cuts of meat or poultry. Eggs. Fish, seafood, and shellfish. Smooth nut butters. Tofu. Dairy All milk products and drinks. Lactose-free milk, including rice, soy, and almond milk. Yogurt without fruit, nuts, chocolate, or granola mixed in. Sour cream. Cottage cheese. Cheese. Fats and oils Olive oil, canola oil, sunflower oil, flaxseed oil, avocado oil, and grapeseed oil. Mayonnaise. Cream cheese. Margarine. Butter. Beverages Decaf coffee. Fruit and vegetable juices. Smoothies (in small amounts, with no pulp or skins, and with fruits from the recommended list). Sports drinks. Herbal tea. Water. Sweets and desserts Plain cakes. Cookies. Cream pies and pies made with recommended fruits. Pudding. Custard. Fruit gelatin. Sherbet.  Ice pops. Ice cream without nuts. Hard candy. Honey. Jelly. Molasses. Syrups. Chocolate. Marshmallows. Gumdrops. Seasonings and condiments Ketchup. Mild mustard. Mild salad dressings. Plain gravies. Vinegar. Spices in moderation. Salt. Sugar. Other  foods Bouillon. Broth. Cream and strained soups made from recommended foods. Casseroles made with recommended foods. The items listed above may not be a complete list of foods and beverages you can eat. Contact a dietitian for more information. What foods should I avoid? Fruits Raw or dried fruit. Berries. Fruit juice with pulp. Prune juice. Vegetables Potato skins. Raw or undercooked vegetables. All beans and bean sprouts. Cooked greens. Corn. Peas. Cabbage. Beets. Broccoli. Brussels sprouts. Cauliflower. Mushrooms. Onions. Peppers. Parsnips. Okra. Sauerkraut. Grains Whole-wheat, whole-grain, or multigrain breads, cereals, or crackers. Rye bread. Cereals with nuts, raisins, or coconut. Bran. Granola. High-fiber cereals. Cornmeal or corn bread. Whole-grain pasta. Wild or brown rice. Quinoa. Popcorn. Buckwheat. Wheat germ. Meats and other proteins Tough, fibrous meats with gristle. Fatty meat. Poultry with skin. Fried meat, Environmental education officerpoultry, or fish. Precooked or cured meat, such as sausages or meat loaves. Tomasa BlaseBacon. Hot dogs. Nuts and chunky nut butter. Dried peas, beans, and lentils. Hummus. Dairy Yogurt with fruit, nuts, chocolate, or granola mixed in. Full-fat dairy such as whole milk, ice cream, or sour cream. Beverages Caffeinated coffee and teas. Fats and oils Avocado. Coconut. Butter. Sweets and desserts Desserts, cookies, or candies that contain nuts or coconut. Dried fruit. Jams and preserves with seeds. Marmalade. Any dessert made with fruits or grains that are not recommended. Seasonings and condiments Relish. Horseradish. Rosita FirePickles. Olives. Other foods Corn tortilla chips. Soups made with vegetables or grains that are not recommended. The items listed above may not be a complete list of foods and beverages you should avoid. Contact a dietitian for more information. Summary  Most people on a low-fiber eating plan should eat less than 10 grams of fiber a day. Follow recommendations from your  health care provider or dietitian about how much fiber you should have each day.  Always check nutrition facts labels to see the dietary fiber amount in packaged foods. A low-fiber food will have less than 2 grams of fiber per serving.  Try to avoid whole grains, raw fruits and vegetables, dried fruit, tough cuts of meat, nuts, and seeds.  Take a vitamin and mineral supplement as told by your health care provider or dietitian. This information is not intended to replace advice given to you by your health care provider. Make sure you discuss any questions you have with your health care provider. Document Revised: 05/02/2019 Document Reviewed: 05/02/2019 Elsevier Patient Education  2021 Elsevier Inc.    High-Fiber Eating Plan Fiber, also called dietary fiber, is a type of carbohydrate. It is found foods such as fruits, vegetables, whole grains, and beans. A high-fiber diet can have many health benefits. Your health care provider may recommend a high-fiber diet to help:  Prevent constipation. Fiber can make your bowel movements more regular.  Lower your cholesterol.  Relieve the following conditions: ? Inflammation of veins in the anus (hemorrhoids). ? Inflammation of specific areas of the digestive tract (uncomplicated diverticulosis). ? A problem of the large intestine, also called the colon, that sometimes causes pain and diarrhea (irritable bowel syndrome, or IBS).  Prevent overeating as part of a weight-loss plan.  Prevent heart disease, type 2 diabetes, and certain cancers. What are tips for following this plan? Reading food labels  Check the nutrition facts label on food products for the amount of dietary fiber.  Choose foods that have 5 grams of fiber or more per serving.  The goals for recommended daily fiber intake include: ? Men (age 45 or younger): 34-38 g. ? Men (over age 41): 28-34 g. ? Women (age 60 or younger): 25-28 g. ? Women (over age 75): 22-25 g. Your daily  fiber goal is _____________ g.   Shopping  Choose whole fruits and vegetables instead of processed forms, such as apple juice or applesauce.  Choose a wide variety of high-fiber foods such as avocados, lentils, oats, and kidney beans.  Read the nutrition facts label of the foods you choose. Be aware of foods with added fiber. These foods often have high sugar and sodium amounts per serving. Cooking  Use whole-grain flour for baking and cooking.  Cook with brown rice instead of white rice. Meal planning  Start the day with a breakfast that is high in fiber, such as a cereal that contains 5 g of fiber or more per serving.  Eat breads and cereals that are made with whole-grain flour instead of refined flour or white flour.  Eat brown rice, bulgur wheat, or millet instead of white rice.  Use beans in place of meat in soups, salads, and pasta dishes.  Be sure that half of the grains you eat each day are whole grains. General information  You can get the recommended daily intake of dietary fiber by: ? Eating a variety of fruits, vegetables, grains, nuts, and beans. ? Taking a fiber supplement if you are not able to take in enough fiber in your diet. It is better to get fiber through food than from a supplement.  Gradually increase how much fiber you consume. If you increase your intake of dietary fiber too quickly, you may have bloating, cramping, or gas.  Drink plenty of water to help you digest fiber.  Choose high-fiber snacks, such as berries, raw vegetables, nuts, and popcorn. What foods should I eat? Fruits Berries. Pears. Apples. Oranges. Avocado. Prunes and raisins. Dried figs. Vegetables Sweet potatoes. Spinach. Kale. Artichokes. Cabbage. Broccoli. Cauliflower. Green peas. Carrots. Squash. Grains Whole-grain breads. Multigrain cereal. Oats and oatmeal. Brown rice. Barley. Bulgur wheat. Millet. Quinoa. Bran muffins. Popcorn. Rye wafer crackers. Meats and other  proteins [place] beans, kidney beans, and pinto beans. Soybeans. Split peas. Lentils. Nuts and seeds. Dairy Fiber-fortified yogurt. Beverages Fiber-fortified soy milk. Fiber-fortified orange juice. Other foods Fiber bars. The items listed above may not be a complete list of recommended foods and beverages. Contact a dietitian for more information. What foods should I avoid? Fruits Fruit juice. Cooked, strained fruit. Vegetables Fried potatoes. Canned vegetables. Well-cooked vegetables. Grains White bread. Pasta made with refined flour. White rice. Meats and other proteins Fatty cuts of meat. Fried chicken or fried fish. Dairy Milk. Yogurt. Cream cheese. Sour cream. Fats and oils Butters. Beverages Soft drinks. Other foods Cakes and pastries. The items listed above may not be a complete list of foods and beverages to avoid. Talk with your dietitian about what choices are best for you. Summary  Fiber is a type of carbohydrate. It is found in foods such as fruits, vegetables, whole grains, and beans.  A high-fiber diet has many benefits. It can help to prevent constipation, lower blood cholesterol, aid weight loss, and reduce your risk of heart disease, diabetes, and certain cancers.  Increase your intake of fiber gradually. Increasing fiber too quickly may cause cramping, bloating, and gas. Drink plenty of water while you increase the amount of fiber you  consume.  The best sources of fiber include whole fruits and vegetables, whole grains, nuts, seeds, and beans. This information is not intended to replace advice given to you by your health care provider. Make sure you discuss any questions you have with your health care provider. Document Revised: 05/02/2019 Document Reviewed: 05/02/2019 Elsevier Patient Education  2021 Elsevier Inc.    Diverticulitis  Diverticulitis is when small pouches in your colon (large intestine) get infected or swollen. This causes pain in the belly  (abdomen) and watery poop (diarrhea). These pouches are called diverticula. The pouches form in people who have a condition called diverticulosis. What are the causes? This condition may be caused by poop (stool) that gets trapped in the pouches in your colon. The poop lets germs (bacteria) grow in the pouches. This causes the infection. What increases the risk? You are more likely to get this condition if you have small pouches in your colon. The risk is higher if:  You are overweight or very overweight (obese).  You do not exercise enough.  You drink alcohol.  You smoke or use products with tobacco in them.  You eat a diet that has a lot of red meat such as beef, pork, or lamb.  You eat a diet that does not have enough fiber in it.  You are older than 55 years of age. What are the signs or symptoms?  Pain in the belly. Pain is often on the left side, but it may be in other areas.  Fever and feeling cold.  Feeling like you may vomit.  Vomiting.  Having cramps.  Feeling full.  Changes to how often you poop.  Blood in your poop. How is this treated? Most cases are treated at home by:  Taking over-the-counter pain medicines.  Following a clear liquid diet.  Taking antibiotic medicines.  Resting. Very bad cases may need to be treated at a hospital. This may include:  Not eating or drinking.  Taking prescription pain medicine.  Getting antibiotic medicines through an IV tube.  Getting fluid and food through an IV tube.  Having surgery. When you are feeling better, your doctor may tell you to have a test to check your colon (colonoscopy). Follow these instructions at home: Medicines  Take over-the-counter and prescription medicines only as told by your doctor. These include: ? Antibiotics. ? Pain medicines. ? Fiber pills. ? Probiotics. ? Stool softeners.  If you were prescribed an antibiotic medicine, take it as told by your doctor. Do not stop taking  the antibiotic even if you start to feel better.  Ask your doctor if the medicine prescribed to you requires you to avoid driving or using machinery. Eating and drinking  Follow a diet as told by your doctor.  When you feel better, your doctor may tell you to change your diet. You may need to eat a lot of fiber. Fiber makes it easier to poop (have a bowel movement). Foods with fiber include: ? Berries. ? Beans. ? Lentils. ? Green vegetables.  Avoid eating red meat.   General instructions  Do not use any products that contain nicotine or tobacco, such as cigarettes, e-cigarettes, and chewing tobacco. If you need help quitting, ask your doctor.  Exercise 3 or more times a week. Try to get 30 minutes each time. Exercise enough to sweat and make your heart beat faster.  Keep all follow-up visits as told by your doctor. This is important. Contact a doctor if:  Your pain does  not get better.  You are not pooping like normal. Get help right away if:  Your pain gets worse.  Your symptoms do not get better.  Your symptoms get worse very fast.  You have a fever.  You vomit more than one time.  You have poop that is: ? Bloody. ? Black. ? Tarry. Summary  This condition happens when small pouches in your colon get infected or swollen.  Take medicines only as told by your doctor.  Follow a diet as told by your doctor.  Keep all follow-up visits as told by your doctor. This is important. This information is not intended to replace advice given to you by your health care provider. Make sure you discuss any questions you have with your health care provider. Document Revised: 10/08/2018 Document Reviewed: 10/08/2018 Elsevier Patient Education  2021 [employer].

## 2020-04-08 NOTE — Progress Notes (Signed)
[facility] Progress Note     Subjective: CC-  Feeling well this morning. Denies abdominal pain, nausea, vomiting. Tolerating full liquids. BM this morning.  WBC 6.9, afebrile.  Objective: Vital signs in last 24 hours: Temp:  [97.9 F (36.6 C)-99.2 F (37.3 C)] 97.9 F (36.6 C) (03/30 0448) Pulse Rate:  [71-82] 71 (03/30 0448) Resp:  [18-20] 18 (03/30 0448) BP: (134-139)/(88-92) 139/88 (03/30 0448) SpO2:  [95 %-99 %] 99 % (03/30 0448) Last BM Date: 04/07/20  Intake/Output from previous day: 03/29 0701 - 03/30 0700 In: 944 [P.O.:944] Out: -  Intake/Output this shift: No intake/output data recorded.  PE: Gen:  Alert, NAD, pleasant Pulm: rate and effort normal Abd: Soft, ND, mild subjective LLQ TTP without rebound or guarding Skin: no rashes noted, warm and dry   Lab Results:  Recent Labs    04/07/20 0355 04/08/20 0358  WBC 7.3 6.9  HGB 14.5 14.4  HCT 42.8 42.8  PLT 228 270   BMET Recent Labs    04/07/20 0355 04/08/20 0358  NA 138 140  K 3.6 3.8  CL 99 101  CO2 27 27  GLUCOSE 93 97  BUN 13 12  CREATININE 1.31* 1.24  CALCIUM 8.9 9.2   PT/INR No results for input(s): LABPROT, INR in the last 72 hours. CMP     Component Value Date/Time   NA 140 04/08/2020 0358   K 3.8 04/08/2020 0358   CL 101 04/08/2020 0358   CO2 27 04/08/2020 0358   GLUCOSE 97 04/08/2020 0358   BUN 12 04/08/2020 0358   CREATININE 1.24 04/08/2020 0358   CALCIUM 9.2 04/08/2020 0358   PROT 7.6 04/08/2020 0358   ALBUMIN 3.8 04/08/2020 0358   AST 15 04/08/2020 0358   ALT 14 04/08/2020 0358   ALKPHOS 47 04/08/2020 0358   BILITOT 1.2 04/08/2020 0358   GFRNONAA >60 04/08/2020 0358   GFRAA >90 10/28/2011 1021   Lipase     Component Value Date/Time   LIPASE 27 04/05/2020 1107       Studies/Results: No results found.  Anti-infectives: Anti-infectives (From admission, onward)   Start     Dose/Rate Route Frequency Ordered Stop   04/05/20 1400   piperacillin-tazobactam (ZOSYN) IVPB 3.375 g        3.375 g 12.5 mL/hr over 240 Minutes Intravenous Every 8 hours 04/05/20 1253     04/05/20 1200  cefTRIAXone (ROCEPHIN) 2 g in sodium chloride 0.9 % 100 mL IVPB       "And" Linked Group Details   2 g 200 mL/hr over 30 Minutes Intravenous  Once 04/05/20 1159 04/05/20 1255   04/05/20 1200  metroNIDAZOLE (FLAGYL) IVPB 500 mg  Status:  Discontinued       "And" Linked Group Details   500 mg 100 mL/hr over 60 Minutes Intravenous  Once 04/05/20 1200 04/05/20 1245       Assessment/Plan Left inguinal hernia - fat containing  Diverticulitis of the descending colon with a small focal collection of extraluminal gas without abscess - 1st bout - last colonoscopy ~2 years ago by Dr. Loreta Ave - CT 3/27 acute diverticulitis of the distal descending colon with pericolic infiltrative changes extending into the proximal sigmoid mesocolon and lateral conal fascia as well as along the posterior pararenal fascia; small focal collection of gas adjacent to colon consistent with contained perforation; no free intraperitoneal air or discrete abscess  ID - zosyn 3/27>>day#4 FEN - soft diet VTE - lovenox Foley - none Follow up -  GI (Dr. Loreta Ave)  Plan - Soft diet this morning. Add probiotic. Ok for discharge if he tolerates diet. Recommend 10 days of PO antibiotics at discharge. Follow up with Dr. Loreta Ave.   LOS: 2 days    Franne Forts, [facility] 04/08/2020, 7:56 AM Please see Amion for pager number during day hours 7:00am-4:30pm

## 2020-04-08 NOTE — Progress Notes (Signed)
Pharmacy Antibiotic Note  Luke Frey is a 55 y.o. male admitted on 04/05/2020 with Sepsis, acute diverticulitis with contained perforation.  Pharmacy has been consulted for Zosyn dosing.  Plan:  Continues on Zosyn 3.375g IV q8h, each dose infused over 4hrs.   Dosage remains stable and need for further dosage adjustment appears unlikely at present.  Pharmacy will sign off at this time.  Please reconsult if a change in clinical status warrants re-evaluation of dosage.    Height: 5\' 9"  (175.3 cm) Weight: 90.7 kg (200 lb) IBW/kg (Calculated) : 70.7  Temp (24hrs), Avg:98.5 F (36.9 C), Min:97.9 F (36.6 C), Max:99.2 F (37.3 C)  Recent Labs  Lab 04/05/20 1107 04/05/20 1536 04/07/20 0355 04/08/20 0358  WBC 13.0*  --  7.3 6.9  CREATININE 1.09  --  1.31* 1.24  LATICACIDVEN  --  1.1  --   --     Estimated Creatinine Clearance: 74.9 mL/min (by C-G formula based on SCr of 1.24 mg/dL).    No Known Allergies  Antimicrobials this admission: 3/27 Zosyn >>  Microbiology results: 3/27 BCx: NGTD  Thank you for allowing pharmacy to be a part of this patient's care.  4/27, PharmD, BCPS Clinical Pharmacist 04/08/2020 9:37 AM

## 2020-04-10 LAB — CULTURE, BLOOD (ROUTINE X 2)
Culture: NO GROWTH
Culture: NO GROWTH
Special Requests: ADEQUATE
Special Requests: ADEQUATE

## 2021-06-10 ENCOUNTER — Other Ambulatory Visit: Payer: Self-pay | Admitting: Internal Medicine

## 2021-06-10 DIAGNOSIS — E781 Pure hyperglyceridemia: Secondary | ICD-10-CM

## 2021-09-17 IMAGING — CT CT ABD-PELV W/ CM
2 of 5 series · 15 of 46 positions shown, 17 images · IV contrast (APPLIED)
Comparison: None

CLINICAL DATA: LEFT lower quadrant pain since yesterday with fever
and change in bowel movements

EXAM:
CT ABDOMEN AND PELVIS WITH CONTRAST
TECHNIQUE: Multidetector CT imaging of the abdomen and pelvis was performed
using the standard protocol following bolus administration of
intravenous contrast. Sagittal and coronal MPR images reconstructed
from axial data set.
CONTRAST:  100mL OMNIPAQUE IOHEXOL 300 MG/ML SOLN IV. No oral
contrast.

[Series 2: axial st · axial · 0.93mm/px · z∈[+901,+1356]mm · 12 of 107 slices shown, 14 images]
[im 8/107  soft-tissue]
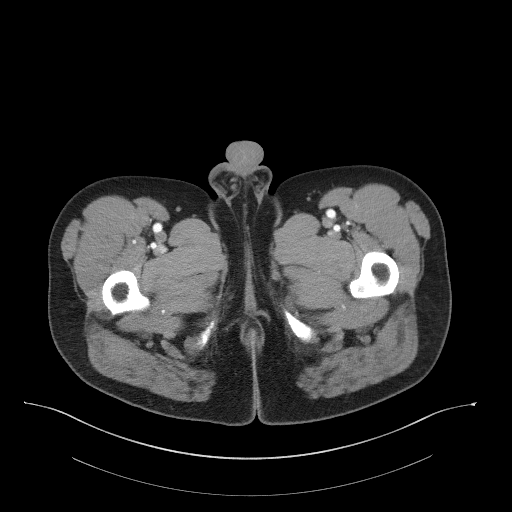
[im 8/107  bone]
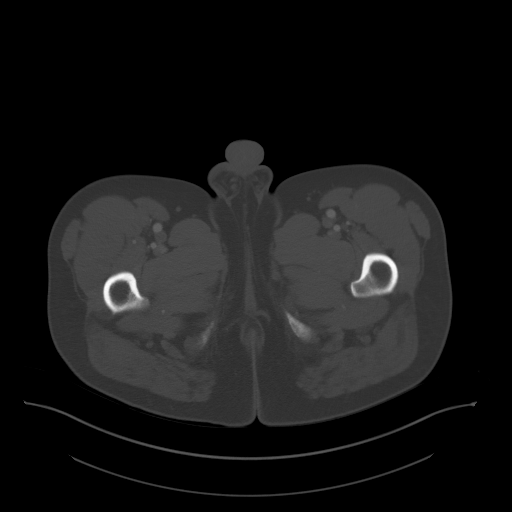
[im 15/107  soft-tissue]
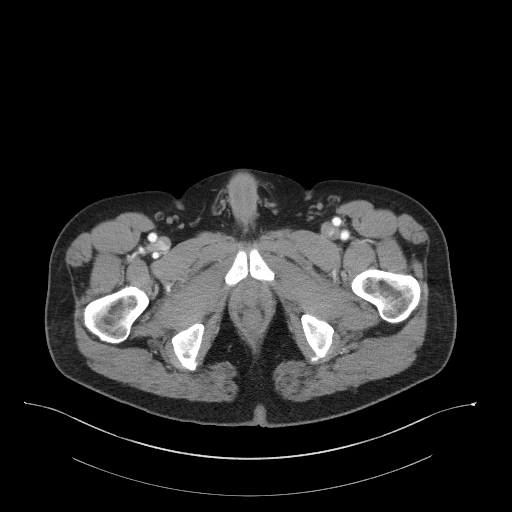
[im 22/107  soft-tissue]
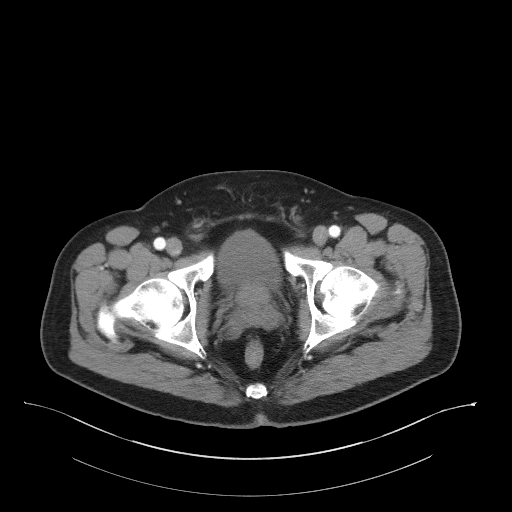
[im 36/107  soft-tissue]
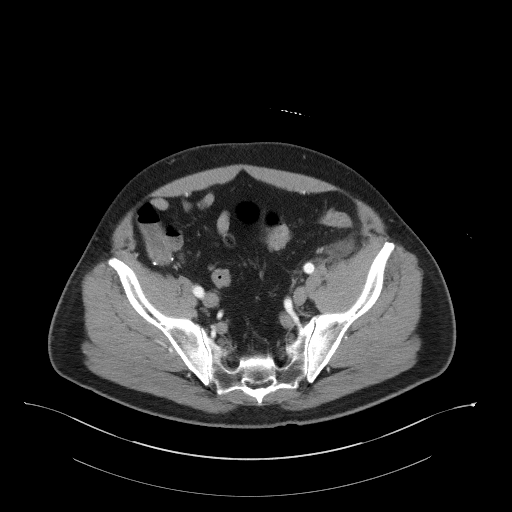
[im 43/107  soft-tissue]
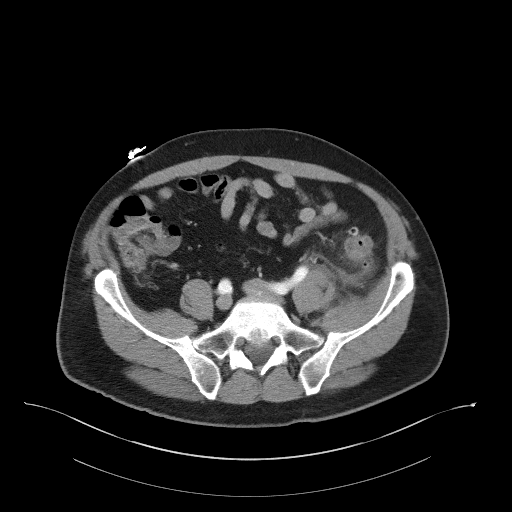
[im 50/107  soft-tissue]
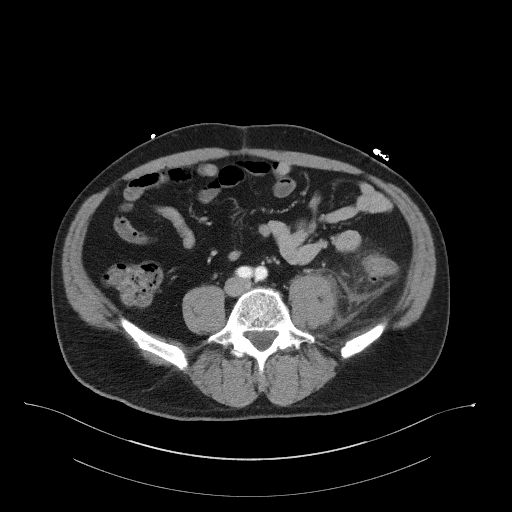
[im 57/107  soft-tissue]
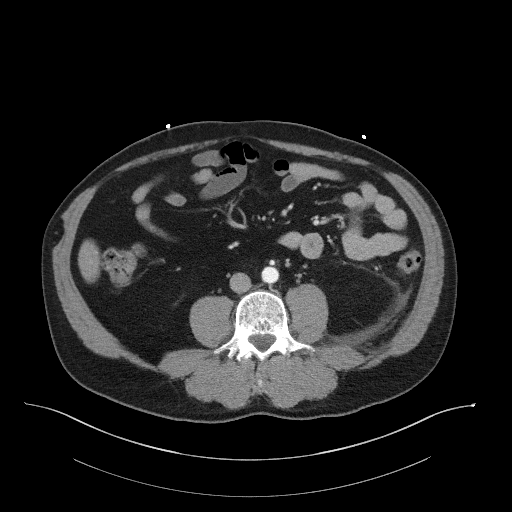
[im 64/107  soft-tissue]
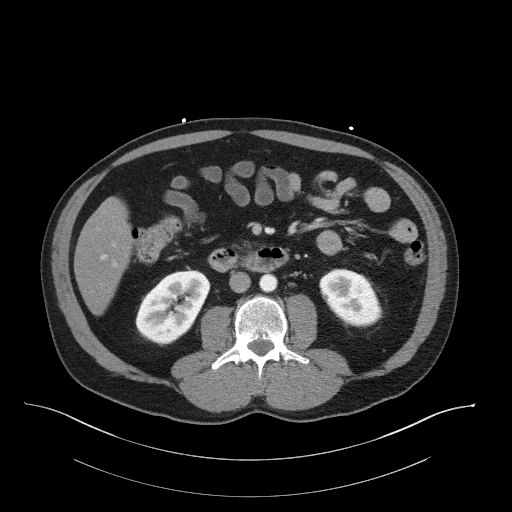
[im 71/107  soft-tissue]
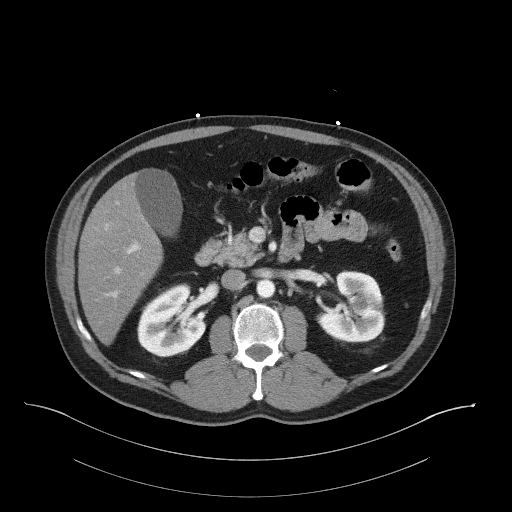
[im 71/107  bone]
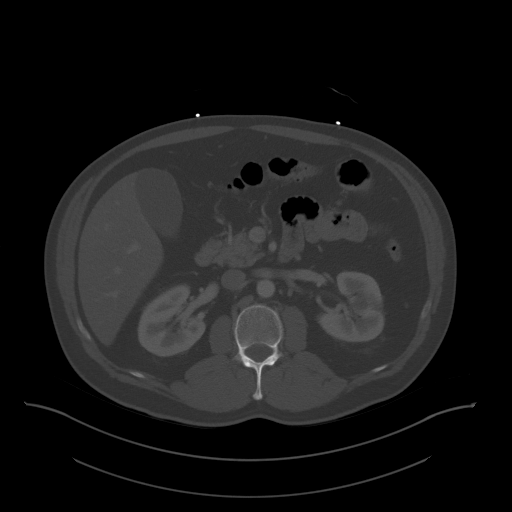
[im 85/107  soft-tissue]
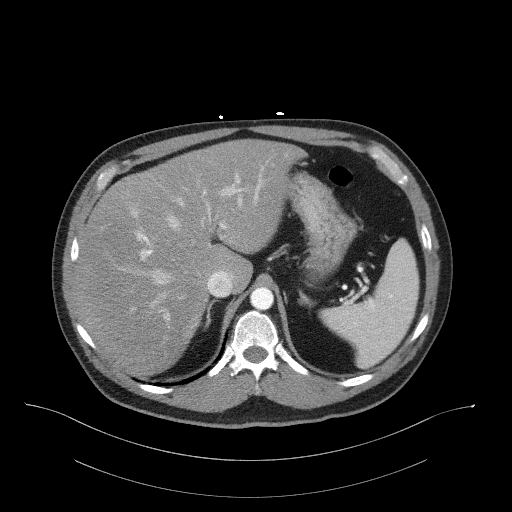
[im 92/107  soft-tissue]
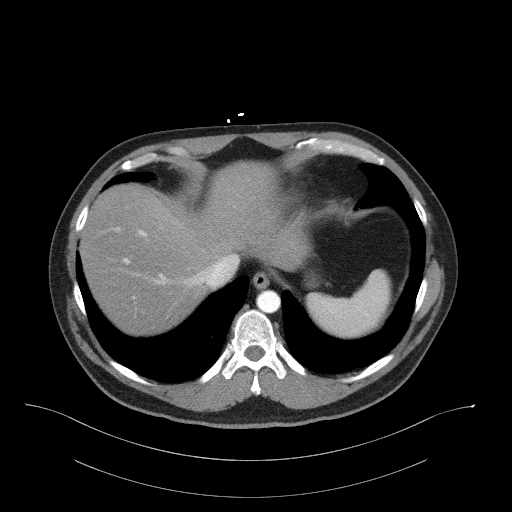
[im 99/107  soft-tissue]
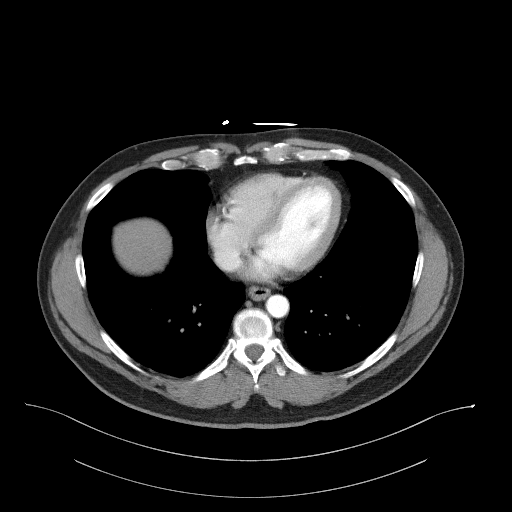

[Series 4: coronal st · coronal · 0.82mm/px · 3 of 104 slices shown]
[im 35/104  soft-tissue]
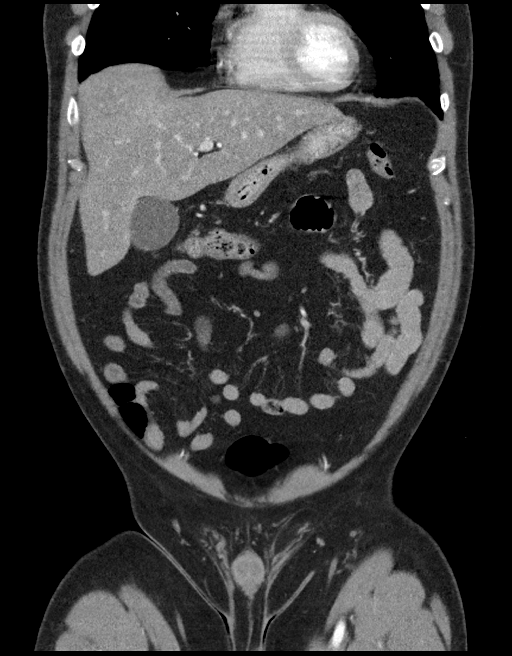
[im 46/104  soft-tissue]
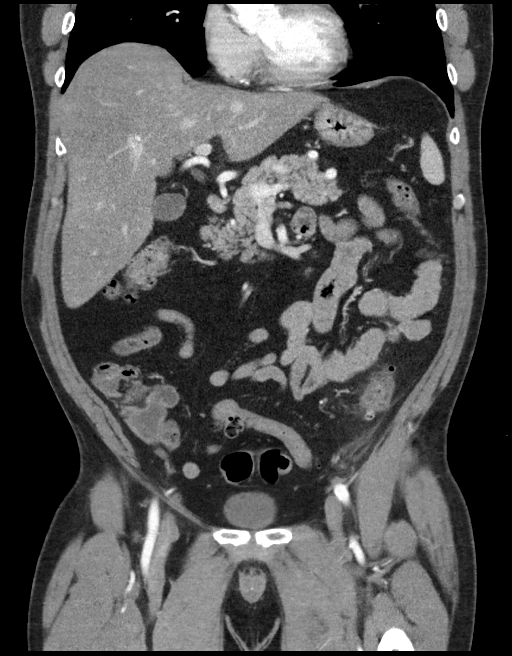
[im 58/104  soft-tissue]
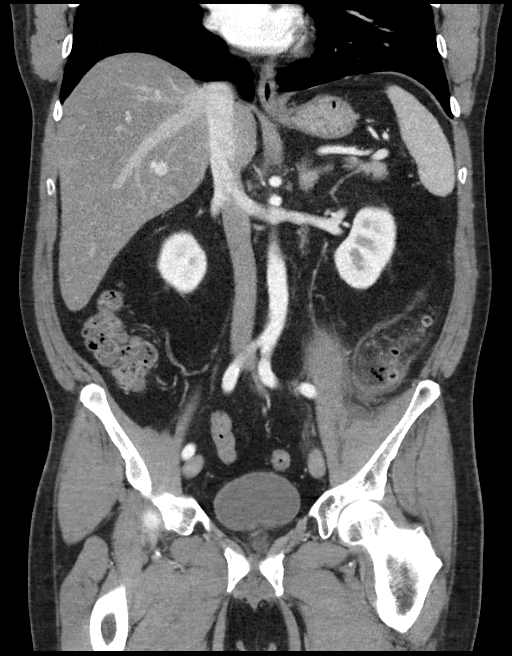

[15 of 46 positions shown; findings below may reference images not displayed]

FINDINGS: Lower chest: Minimal dependent density/atelectasis at lung bases

Hepatobiliary: Fatty infiltration of liver. Subtle nodular foci
within gallbladder question small gallstones versus polyps. Two
small probable hepatic cysts.

Pancreas: Normal appearance

Spleen: Normal appearance

Adrenals/Urinary Tract: LEFT renal cysts. Adrenal glands, kidneys,
ureters, and bladder otherwise normal appearance.

Stomach/Bowel: Prior appendectomy. Diverticulosis of descending and
sigmoid colon with wall thickening and pericolic infiltrative
changes at distal descending colon consistent with acute
diverticulitis. Infiltration/edema extends into the proximal sigmoid
mesocolon and lateral conal fascia as well as along the posterior
pararenal fascia. Small focal collection of gas adjacent to colon
1.6 cm diameter consistent with contained local perforation. No
discrete abscess. Stomach and remaining bowel loops unremarkable.

Vascular/Lymphatic: Vascular structures patent.  No adenopathy.

Reproductive: Prostate gland and seminal vesicles normal appearance

Other: Small LEFT inguinal hernia containing fat. No free air or
free fluid.

Musculoskeletal: No acute osseous findings.
IMPRESSION: Acute diverticulitis of the distal descending colon with pericolic
infiltrative changes extending into the proximal sigmoid mesocolon
and lateral conal fascia as well as along the posterior pararenal
fascia.

Small focal collection of gas adjacent to colon consistent with
contained perforation.

No free intraperitoneal air or discrete abscess.

Fatty infiltration of liver with small probable hepatic cysts.

Small LEFT inguinal hernia containing fat.

Findings called to Emichan Farfan Hata PA on 04/05/2020 at 3321 hours.

## 2021-12-28 ENCOUNTER — Other Ambulatory Visit (HOSPITAL_BASED_OUTPATIENT_CLINIC_OR_DEPARTMENT_OTHER): Payer: Self-pay

## 2021-12-28 MED ORDER — COVID-19 MRNA VAC-TRIS(PFIZER) 30 MCG/0.3ML IM SUSY
0.3000 mL | PREFILLED_SYRINGE | Freq: Once | INTRAMUSCULAR | 0 refills | Status: AC
  Filled 2021-12-28: qty 0.3, 1d supply, fill #0

## 2022-01-05 ENCOUNTER — Other Ambulatory Visit: Payer: Self-pay | Admitting: General Surgery

## 2022-01-05 DIAGNOSIS — R1900 Intra-abdominal and pelvic swelling, mass and lump, unspecified site: Secondary | ICD-10-CM

## 2022-02-03 ENCOUNTER — Encounter: Payer: Self-pay | Admitting: General Surgery

## 2022-02-07 ENCOUNTER — Ambulatory Visit
Admission: RE | Admit: 2022-02-07 | Discharge: 2022-02-07 | Disposition: A | Discharge status: Home / Self Care | Payer: BC Managed Care – PPO | Source: Ambulatory Visit | Attending: General Surgery | Admitting: General Surgery

## 2022-02-07 DIAGNOSIS — R1900 Intra-abdominal and pelvic swelling, mass and lump, unspecified site: Secondary | ICD-10-CM

## 2022-02-07 MED ORDER — IOPAMIDOL (ISOVUE-300) INJECTION 61%
100.0000 mL | Freq: Once | INTRAVENOUS | Status: AC | PRN
  Administered 2022-02-07: 100 mL via INTRAVENOUS

## 2022-03-27 ENCOUNTER — Emergency Department (HOSPITAL_COMMUNITY)
Admission: EM | Admit: 2022-03-27 | Discharge: 2022-03-27 | Disposition: A | Discharge status: Home / Self Care | Payer: BC Managed Care – PPO | Attending: Emergency Medicine | Admitting: Emergency Medicine

## 2022-03-27 ENCOUNTER — Emergency Department (HOSPITAL_COMMUNITY): Payer: BC Managed Care – PPO

## 2022-03-27 ENCOUNTER — Other Ambulatory Visit: Payer: Self-pay

## 2022-03-27 DIAGNOSIS — R42 Dizziness and giddiness: Secondary | ICD-10-CM | POA: Insufficient documentation

## 2022-03-27 LAB — CBC WITH DIFFERENTIAL/PLATELET
Abs Immature Granulocytes: 0.11 10*3/uL — ABNORMAL HIGH (ref 0.00–0.07)
Basophils Absolute: 0.1 10*3/uL (ref 0.0–0.1)
Basophils Relative: 1 %
Eosinophils Absolute: 0.1 10*3/uL (ref 0.0–0.5)
Eosinophils Relative: 1 %
HCT: 45.4 % (ref 39.0–52.0)
Hemoglobin: 15.7 g/dL (ref 13.0–17.0)
Immature Granulocytes: 1 %
Lymphocytes Relative: 17 %
Lymphs Abs: 1.9 10*3/uL (ref 0.7–4.0)
MCH: 29.7 pg (ref 26.0–34.0)
MCHC: 34.6 g/dL (ref 30.0–36.0)
MCV: 86 fL (ref 80.0–100.0)
Monocytes Absolute: 0.7 10*3/uL (ref 0.1–1.0)
Monocytes Relative: 6 %
Neutro Abs: 8 10*3/uL — ABNORMAL HIGH (ref 1.7–7.7)
Neutrophils Relative %: 74 %
Platelets: 273 10*3/uL (ref 150–400)
RBC: 5.28 MIL/uL (ref 4.22–5.81)
RDW: 12.1 % (ref 11.5–15.5)
WBC: 10.8 10*3/uL — ABNORMAL HIGH (ref 4.0–10.5)
nRBC: 0 % (ref 0.0–0.2)

## 2022-03-27 LAB — COMPREHENSIVE METABOLIC PANEL
ALT: 18 U/L (ref 0–44)
AST: 22 U/L (ref 15–41)
Albumin: 4.3 g/dL (ref 3.5–5.0)
Alkaline Phosphatase: 60 U/L (ref 38–126)
Anion gap: 15 (ref 5–15)
BUN: 16 mg/dL (ref 6–20)
CO2: 19 mmol/L — ABNORMAL LOW (ref 22–32)
Calcium: 9.1 mg/dL (ref 8.9–10.3)
Chloride: 106 mmol/L (ref 98–111)
Creatinine, Ser: 1.19 mg/dL (ref 0.61–1.24)
GFR, Estimated: >60 mL/min (ref >60)
Glucose, Bld: 135 mg/dL — ABNORMAL HIGH (ref 70–99)
Potassium: 3.6 mmol/L (ref 3.5–5.1)
Sodium: 140 mmol/L (ref 135–145)
Total Bilirubin: 0.7 mg/dL (ref 0.3–1.2)
Total Protein: 7.3 g/dL (ref 6.5–8.1)

## 2022-03-27 LAB — TROPONIN I (HIGH SENSITIVITY)
Troponin I (High Sensitivity): 3 ng/L (ref <18)
Troponin I (High Sensitivity): <2 ng/L (ref <18)

## 2022-03-27 MED ORDER — ONDANSETRON HCL 4 MG/2ML IJ SOLN
4.0000 mg | Freq: Once | INTRAMUSCULAR | Status: AC
  Administered 2022-03-27: 4 mg via INTRAVENOUS
  Filled 2022-03-27: qty 2

## 2022-03-27 MED ORDER — MECLIZINE HCL 25 MG PO TABS: 12.5000 mg | ORAL_TABLET | Freq: Three times a day (TID) | ORAL | 0 refills | Status: AC | PRN

## 2022-03-27 MED ORDER — DIAZEPAM 5 MG PO TABS: 2.5000 mg | ORAL_TABLET | Freq: Three times a day (TID) | ORAL | 0 refills | Status: AC | PRN

## 2022-03-27 MED ORDER — ONDANSETRON HCL 4 MG/2ML IJ SOLN
INTRAMUSCULAR | Status: AC
  Administered 2022-03-27: 4 mg
  Filled 2022-03-27: qty 2

## 2022-03-27 MED ORDER — DIAZEPAM 5 MG/ML IJ SOLN
2.5000 mg | Freq: Once | INTRAMUSCULAR | Status: AC
  Administered 2022-03-27: 2.5 mg via INTRAVENOUS
  Filled 2022-03-27: qty 2

## 2022-03-27 MED ORDER — MECLIZINE HCL 25 MG PO TABS
25.0000 mg | ORAL_TABLET | Freq: Once | ORAL | Status: AC
  Administered 2022-03-27: 25 mg via ORAL
  Filled 2022-03-27: qty 1

## 2022-03-27 MED ORDER — SODIUM CHLORIDE 0.9 % IV BOLUS
1000.0000 mL | Freq: Once | INTRAVENOUS | Status: AC
  Administered 2022-03-27: 1000 mL via INTRAVENOUS

## 2022-03-27 MED ORDER — ONDANSETRON HCL 4 MG PO TABS: 4.0000 mg | ORAL_TABLET | Freq: Four times a day (QID) | ORAL | 0 refills | Status: AC | PRN

## 2022-03-27 NOTE — ED Provider Notes (Signed)
[facility] Provider Note   CSN: [id number] Arrival date & time: 03/27/22  1532     History  Chief Complaint  Patient presents with   Dizziness    Luke Frey is a 57 y.o. male.  57 year old male with prior medical history as detailed below presents for evaluation.  Patient reports that over the last week he had several episodes of vertiginous symptoms.  First symptom was fairly mild.  It resolved fairly quickly.  He had recurrent vertigo on Thursday.  He vomited with this episode.  He did obtain meclizine OTC and did use this at home with some improvement of his symptoms.  Patient reports that sometimes his vertiginous symptoms are preceded by a "buzzing" in the left ear.  Patient with significant dizziness today.  Patient with active vomiting on arrival.  Patient was unable to take meclizine prior to arrival today.  The history is provided by the patient and medical records.       Home Medications Prior to Admission medications   Medication Sig Start Date End Date Taking? Authorizing Provider  simethicone (MYLICON) 80 MG chewable tablet Chew 80 mg by mouth every 6 (six) hours as needed for flatulence.    [provider]      Allergies    Patient has no known allergies.    Review of Systems   Review of Systems  All other systems reviewed and are negative.   Physical Exam Updated Vital Signs BP 136/81   Pulse 79   Temp 97.8 F (36.6 C) (Oral)   Resp 18   SpO2 96%  Physical Exam Vitals and nursing note reviewed.  Constitutional:      General: He is not in acute distress.    Appearance: He is well-developed.     Comments: Alert, eyes closed, actively vomiting.  Complains of significant vertigo.  Normal speech, 5 out of 5 strength in both upper and lower extremities.  HENT:     Head: Normocephalic and atraumatic.  Eyes:     Conjunctiva/sclera: Conjunctivae normal.     Pupils: Pupils are equal, round, and  reactive to light.  Cardiovascular:     Rate and Rhythm: Normal rate and regular rhythm.     Heart sounds: Normal heart sounds.  Pulmonary:     Effort: Pulmonary effort is normal. No respiratory distress.     Breath sounds: Normal breath sounds.  Abdominal:     General: There is no distension.     Palpations: Abdomen is soft.     Tenderness: There is no abdominal tenderness.  Musculoskeletal:        General: No deformity. Normal range of motion.     Cervical back: Normal range of motion and neck supple.  Skin:    General: Skin is warm and dry.  Neurological:     General: No focal deficit present.     Mental Status: He is alert and oriented to person, place, and time. Mental status is at baseline.     Cranial Nerves: No cranial nerve deficit.     Sensory: No sensory deficit.     Motor: No weakness.     Comments: Mild horizontal nystagmus noted on exam     ED Results / Procedures / Treatments   Labs (all labs ordered are listed, but only abnormal results are displayed) Labs Reviewed  CBC WITH DIFFERENTIAL/PLATELET - Abnormal; Notable for the following components:      Result Value   WBC  10.8 (*)    Neutro Abs 8.0 (*)    Abs Immature Granulocytes 0.11 (*)    All other components within normal limits  COMPREHENSIVE METABOLIC PANEL - Abnormal; Notable for the following components:   CO2 19 (*)    Glucose, Bld 135 (*)    All other components within normal limits  TROPONIN I (HIGH SENSITIVITY)  TROPONIN I (HIGH SENSITIVITY)    EKG None  Radiology CT Head Wo Contrast  Result Date: 03/27/2022 CLINICAL DATA:  Vertigo. EXAM: CT HEAD WITHOUT CONTRAST TECHNIQUE: Contiguous axial images were obtained from the base of the skull through the vertex without intravenous contrast. RADIATION DOSE REDUCTION: This exam was performed according to the departmental dose-optimization program which includes automated exposure control, adjustment of the mA and/or kV according to patient size  and/or use of iterative reconstruction technique. COMPARISON:  None Available. FINDINGS: Brain: There is no evidence for acute hemorrhage, hydrocephalus, mass lesion, or abnormal extra-axial fluid collection. No definite CT evidence for acute infarction. Vascular: No hyperdense vessel or unexpected calcification. Skull: No evidence for fracture. No worrisome lytic or sclerotic lesion. Sinuses/Orbits: The visualized paranasal sinuses and mastoid air cells are clear. Visualized portions of the globes and intraorbital fat are unremarkable. Other: None IMPRESSION: No acute intracranial abnormality. Electronically Signed   By: Misty Stanley M.D.   On: 03/27/2022 17:11    Procedures Procedures    Medications Ordered in ED Medications  ondansetron (ZOFRAN) 4 MG/2ML injection (4 mg  Given 03/27/22 1601)  sodium chloride 0.9 % bolus 1,000 mL (1,000 mLs Intravenous New Bag/Given 03/27/22 1608)  diazepam (VALIUM) injection 2.5 mg (2.5 mg Intravenous Given 03/27/22 1605)  ondansetron (ZOFRAN) injection 4 mg (4 mg Intravenous Given 03/27/22 1707)  meclizine (ANTIVERT) tablet 25 mg (25 mg Oral Given 03/27/22 1708)    ED Course/ Medical Decision Making/ A&P                             Medical Decision Making Amount and/or Complexity of Data Reviewed Labs: ordered. Radiology: ordered.  Risk Prescription drug management.    Medical Screen Complete  This patient presented to the ED with complaint of vertigo, vomiting.  This complaint involves an extensive number of treatment options. The initial differential diagnosis includes, but is not limited to, peripheral versus central vertigo  This presentation is: Acute, Self-Limited, Previously Undiagnosed, Uncertain Prognosis, Complicated, Systemic Symptoms, and Threat to Life/Bodily Function  Patient is presenting with acute vertigo.  Patient is experiencing significant nausea and vomiting with his vertiginous symptoms.  Patient feels significantly  improved with administration of meclizine, Zofran, Valium.  CT and MR imaging is without evidence of acute stroke or other acute pathology.  Patient's workup and imaging results discussed extensively with both the patient and his wife at bedside.  At time of discharge the patient is now comfortable, "96% better", and desires discharge.  He declines additional medication, observation, admission.  He does understand need for close outpatient follow-up with his PCP and with ENT.  Importance of close follow-up is stressed.  Strict turn precautions given and understood.   Additional history obtained:  Additional history obtained from [facility] External records from outside sources obtained and reviewed including prior ED visits and prior Inpatient records.    Lab Tests:  I ordered and personally interpreted labs.  The pertinent results include: CBC, CMP, troponin x 2   Imaging Studies ordered:  I ordered imaging studies including CT head,  MRI brain I independently visualized and interpreted obtained imaging which showed no acute pathology I agree with the radiologist interpretation.   Cardiac Monitoring:  The patient was maintained on a cardiac monitor.  I personally viewed and interpreted the cardiac monitor which showed an underlying rhythm of: NSR   Medicines ordered:  I ordered medication including Zofran, meclizine, Valium, IV fluids for vertigo, vomiting Reevaluation of the patient after these medicines showed that the patient: improved   Problem List / ED Course:  Vertigo, nausea, vomiting   Reevaluation:  After the interventions noted above, I reevaluated the patient and found that they have: improved  Disposition:  After consideration of the diagnostic results and the patients response to treatment, I feel that the patent would benefit from close outpatient follow-up.          Final Clinical Impression(s) / ED Diagnoses Final diagnoses:  Vertigo     Rx / DC Orders ED Discharge Orders          Ordered    meclizine (ANTIVERT) 25 MG tablet  3 times daily PRN        03/27/22 1932    ondansetron (ZOFRAN) 4 MG tablet  Every 6 hours PRN        03/27/22 1932    diazepam (VALIUM) 5 MG tablet  Every 8 hours PRN        03/27/22 1932              Valarie Merino, MD 03/27/22 1936

## 2022-03-27 NOTE — Discharge Instructions (Addendum)
Return for any problem.   As instructed, if you experience recurrent vertigo -- take meclizine for dizziness.  Take Zofran for nausea.  If dizziness and nausea persist you may take Valium as prescribed.

## 2022-03-27 NOTE — ED Notes (Signed)
Patient transported to MRI 

## 2022-03-27 NOTE — ED Triage Notes (Signed)
Pt's wife reports that one week ago patient mentioned to her that he was dizzy. Then Thursday he was dizzy and vomited. That has happened a few more times since then. These episodes are often preceded by a buzzing in the ear, followed by dizziness and vomiting.

## 2022-03-31 ENCOUNTER — Other Ambulatory Visit: Payer: Self-pay | Admitting: Internal Medicine

## 2022-03-31 DIAGNOSIS — E781 Pure hyperglyceridemia: Secondary | ICD-10-CM

## 2022-03-31 DIAGNOSIS — Z0000 Encounter for general adult medical examination without abnormal findings: Secondary | ICD-10-CM

## 2022-04-07 ENCOUNTER — Other Ambulatory Visit (HOSPITAL_BASED_OUTPATIENT_CLINIC_OR_DEPARTMENT_OTHER): Payer: Self-pay | Admitting: Internal Medicine

## 2022-04-07 DIAGNOSIS — R030 Elevated blood-pressure reading, without diagnosis of hypertension: Secondary | ICD-10-CM

## 2022-05-03 ENCOUNTER — Other Ambulatory Visit: Payer: Self-pay

## 2022-05-04 ENCOUNTER — Other Ambulatory Visit: Payer: BC Managed Care – PPO

## 2022-05-06 ENCOUNTER — Ambulatory Visit (HOSPITAL_COMMUNITY)
Admission: RE | Admit: 2022-05-06 | Discharge: 2022-05-06 | Disposition: A | Discharge status: Home / Self Care | Payer: BC Managed Care – PPO | Source: Ambulatory Visit | Attending: Internal Medicine | Admitting: Internal Medicine

## 2022-05-06 DIAGNOSIS — R030 Elevated blood-pressure reading, without diagnosis of hypertension: Secondary | ICD-10-CM

## 2022-05-20 ENCOUNTER — Encounter: Payer: Self-pay | Admitting: Internal Medicine

## 2022-05-20 ENCOUNTER — Ambulatory Visit: Payer: BC Managed Care – PPO | Attending: Internal Medicine | Admitting: Internal Medicine

## 2022-05-20 VITALS — BP 108/66 | HR 81 | Ht 69.0 in | Wt 199.4 lb

## 2022-05-20 DIAGNOSIS — R931 Abnormal findings on diagnostic imaging of heart and coronary circulation: Secondary | ICD-10-CM

## 2022-05-20 NOTE — Progress Notes (Signed)
Cardiology Office Note:    Date:  05/20/2022   ID:  Luke Frey, DOB 1965-12-15, MRN [id number]  PCP:  Creola Corn, MD   [facility] Providers Cardiologist:  None     Referring MD: Creola Corn, MD   No chief complaint on file. Elevated CAC  History of Present Illness:    Luke Frey is a 57 y.o. male with no sig pmhx, referral from Dr. Timothy Lasso for an elevated CAC.  Father had a CABG around 43. Grandfather had MI 70. He goes to the gym about 3x per week.  He denies CP or SOB. No syncope. No smoking. No DM2. Normal blood pressure. 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    Past Medical History:  Diagnosis Date   No pertinent past medical history     Past Surgical History:  Procedure Laterality Date   LAPAROSCOPIC APPENDECTOMY  10/28/2011   Procedure: APPENDECTOMY LAPAROSCOPIC;  Surgeon: Shelly Rubenstein, MD;  Location: [facility] OR;  Service: General;  Laterality: N/A;   NO PAST SURGERIES      Current Medications: No outpatient medications have been marked as taking for the 05/20/22 encounter (Appointment) with Maisie Fus, MD.     Allergies:   Patient has no known allergies.   Social History   Socioeconomic History   Marital status: Married    Spouse name: Not on file   Number of children: Not on file   Years of education: Not on file   Highest education level: Not on file  Occupational History   Not on file  Tobacco Use   Smoking status: Never   Smokeless tobacco: Never  Substance and Sexual Activity   Alcohol use: Yes   Drug use: No   Sexual activity: Not on file  Other Topics Concern   Not on file  Social History Narrative   Not on file   Social Determinants of Health   Financial Resource Strain: Not on file  Food Insecurity: Not on file  Transportation Needs: Not on file  Physical Activity: Not on file  Stress: Not on file  Social Connections: Not on file     Family History: Per above  ROS:   Please see the history of present illness.      All other systems reviewed and are negative.  EKGs/Labs/Other Studies Reviewed:    The following studies were reviewed today:   EKG:  EKG is  ordered today.  The ekg ordered today demonstrates  05/20/2022- NSR, poor r wave progression  Recent Labs: 03/27/2022: ALT 18; BUN 16; Creatinine, Ser 1.19; Hemoglobin 15.7; Platelets 273; Potassium 3.6; Sodium 140  Recent Lipid Panel No results found for: "CHOL", "TRIG", "HDL", "CHOLHDL", "VLDL", "LDLCALC", "LDLDIRECT"   Risk Assessment/Calculations:     Physical Exam:    VS:   Vitals:   05/20/22 1545  BP: 108/66  Pulse: 81  SpO2: 96%     Wt Readings from Last 3 Encounters:  04/05/20 200 lb (90.7 kg)  10/28/11 190 lb (86.2 kg)     GEN:  Well nourished, well developed in no acute distress HEENT: Normal NECK: No JVD CARDIAC: RRR, no murmurs, rubs, gallops RESPIRATORY:  Clear to auscultation without rales, wheezing or rhonchi  ABDOMEN: Soft, non-tender, non-distended MUSCULOSKELETAL:  No edema; No deformity  SKIN: Warm and dry NEUROLOGIC:  Alert and oriented x 3 PSYCHIATRIC:  Normal affect   ASSESSMENT:    Elevated CAC: 992. 98th percentile. Elevated CAC: > 75th percentile. CAC mostly  in the RCA, LAD as well. No LM disease.   Recommend continue crestor and asa 81 mg daily. LDL goal < 70 mg/dL. Recommend continued lifestyle modifications including healthy diet, lipid management, DM2 screening, and exercise. We discussed if having symptoms of CP or SOB, will plan for LHC. Will have a low threshold.  PLAN:    In order of problems listed above:   3 months Fasting lipids Follow up 6 months      Medication Adjustments/Labs and Tests Ordered: Current medicines are reviewed at length with the patient today.  Concerns regarding medicines are outlined above.  No orders of the defined types were placed in this encounter.  No orders of the defined types were placed in this encounter.   There are no Patient Instructions on  file for this visit.   Signed, Maisie Fus, MD  05/20/2022 2:29 PM    [facility]

## 2022-05-20 NOTE — Patient Instructions (Signed)
Medication Instructions:  No changes *If you need a refill on your cardiac medications before your next appointment, please call your pharmacy*   Lab Work: Lipid panel- Please return for Blood Work in 3 months- Fasting. No appointment needed, lab here at the office is open Monday-Friday from 8AM to 4PM. If you have labs (blood work) drawn today and your tests are completely normal, you will receive your results only by: MyChart Message (if you have MyChart) OR A paper copy in the mail If you have any lab test that is abnormal or we need to change your treatment, we will call you to review the results.  Follow-Up: At [facility], you and your health needs are our priority.  As part of our continuing mission to provide you with exceptional heart care, we have created designated Provider Care Teams.  These Care Teams include your primary Cardiologist (physician) and Advanced Practice Providers (APPs -  Physician Assistants and Nurse Practitioners) who all work together to provide you with the care you need, when you need it.  We recommend signing up for the patient portal called "MyChart".  Sign up information is provided on this After Visit Summary.  MyChart is used to connect with patients for Virtual Visits (Telemedicine).  Patients are able to view lab/test results, encounter notes, upcoming appointments, etc.  Non-urgent messages can be sent to your provider as well.   To learn more about what you can do with MyChart, go to [email or web address].    Your next appointment:   6 month(s)  Provider:   Dr Wyline Mood

## 2022-11-23 ENCOUNTER — Encounter: Payer: Self-pay | Admitting: Internal Medicine

## 2022-11-23 ENCOUNTER — Ambulatory Visit: Payer: BC Managed Care – PPO | Attending: Internal Medicine | Admitting: Internal Medicine

## 2022-11-23 VITALS — BP 112/90 | HR 81 | Ht 70.0 in | Wt 205.6 lb

## 2022-11-23 DIAGNOSIS — R931 Abnormal findings on diagnostic imaging of heart and coronary circulation: Secondary | ICD-10-CM

## 2022-11-23 MED ORDER — ASPIRIN 81 MG PO TBEC: 81.0000 mg | DELAYED_RELEASE_TABLET | Freq: Every day | ORAL | 3 refills | Status: AC

## 2022-11-23 MED ORDER — ROSUVASTATIN CALCIUM 20 MG PO TABS: 20.0000 mg | ORAL_TABLET | Freq: Every day | ORAL | 3 refills | Status: DC

## 2022-11-23 NOTE — Progress Notes (Signed)
Cardiology Office Note:    Date:  11/23/2022   ID:  Luke Frey, DOB [birth year], MRN [id number]  PCP:  Creola Corn, MD   [facility] Providers Cardiologist:  None     Referring MD: Creola Corn, MD   No chief complaint on file. Elevated CAC  History of Present Illness:    Luke Frey is a 57 y.o. male with no sig pmhx, referral from Dr. Timothy Lasso for an elevated CAC.  Father had a CABG around 86. Grandfather had MI 37. He goes to the gym about 3x per week.  He denies CP or SOB. No syncope. No smoking. No DM2. Normal blood pressure. 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    Interim hx 11/23/2022 He was diagnosed with Bells Palsy. He has meniere's disease. He denies CP or SOB. He denies fatigue.  His son is graduating from [employer] and he is headed to [employer].  Current Medications: Current Meds  Medication Sig   aspirin EC 81 MG tablet Take 1 tablet by mouth daily.   GLYCINE500 PO Take by mouth.   Magnesium 500 MG CAPS Take 1,000 mg by mouth daily.   rosuvastatin (CRESTOR) 20 MG tablet Take 1 tablet by mouth daily.   TAURINE PO Take 2,000 mg by mouth daily.   valACYclovir (VALTREX) 1000 MG tablet Take 1,000 mg by mouth 3 (three) times daily.   vitamin B-12 (CYANOCOBALAMIN) 100 MCG tablet Take 100 mcg by mouth daily.     Family History: Per above  ROS:   Please see the history of present illness.     All other systems reviewed and are negative.  EKGs/Labs/Other Studies Reviewed:    The following studies were reviewed today:   EKG:  EKG is  ordered today.  The ekg ordered today demonstrates  05/20/2022- NSR, poor r wave progression  Recent Labs: 03/27/2022: ALT 18; BUN 16; Creatinine, Ser 1.19; Hemoglobin 15.7; Platelets 273; Potassium 3.6; Sodium 140  Recent Lipid Panel    Component Value Date/Time   CHOL 136 08/19/2022 0837   TRIG 175 (H) 08/19/2022 0837   HDL 41 08/19/2022 0837   CHOLHDL 3.3 08/19/2022 0837   LDLCALC 65 08/19/2022 0837     Risk  Assessment/Calculations:     Physical Exam:    VS:   Vitals:   11/23/22 1519  BP: (!) 112/90  Pulse: 81  SpO2: 96%     Wt Readings from Last 3 Encounters:  11/23/22 205 lb 9.6 oz (93.3 kg)  05/20/22 199 lb 6.4 oz (90.4 kg)  04/05/20 200 lb (90.7 kg)     GEN:  Well nourished, well developed in no acute distress HEENT: Normal NECK: No JVD CARDIAC: RRR, no murmurs, rubs, gallops RESPIRATORY:  Clear to auscultation without rales, wheezing or rhonchi  ABDOMEN: Soft, non-tender, non-distended MUSCULOSKELETAL:  No edema; No deformity  SKIN: Warm and dry NEUROLOGIC:  Alert and oriented x 3 PSYCHIATRIC:  Normal affect   ASSESSMENT:    Elevated CAC: 992. 98th percentile. Elevated CAC: > 75th percentile. CAC mostly in the RCA, LAD as well. No LM disease.   Recommend continue crestor and asa 81 mg daily. LDL goal < 70 mg/dL.  He is at goal.recommend continued lifestyle modifications including healthy diet, lipid management, DM2 screening, and exercise. We discussed if having symptoms of CP or SOB, will plan for LHC. Will have a low threshold.  So far he has been asymptomatic and therefore we will continue to watch.  PLAN:  In order of problems listed above:    Refills for the year Follow up  1 year      Medication Adjustments/Labs and Tests Ordered: Current medicines are reviewed at length with the patient today.  Concerns regarding medicines are outlined above.  No orders of the defined types were placed in this encounter.  No orders of the defined types were placed in this encounter.   There are no Patient Instructions on file for this visit.   Signed, Maisie Fus, MD  11/23/2022 3:26 PM    [facility]

## 2022-11-23 NOTE — Patient Instructions (Addendum)
Medication Instructions:  - Refilled your Aspirin 81mg  and Crestor today   *If you need a refill on your cardiac medications before your next appointment, please call your pharmacy*   Lab Work: None    If you have labs (blood work) drawn today and your tests are completely normal, you will receive your results only by: MyChart Message (if you have MyChart) OR A paper copy in the mail If you have any lab test that is abnormal or we need to change your treatment, we will call you to review the results.   Testing/Procedures: None    Follow-Up: At [facility], you and your health needs are our priority.  As part of our continuing mission to provide you with exceptional heart care, we have created designated Provider Care Teams.  These Care Teams include your primary Cardiologist (physician) and Advanced Practice Providers (APPs -  Physician Assistants and Nurse Practitioners) who all work together to provide you with the care you need, when you need it.  We recommend signing up for the patient portal called "MyChart".  Sign up information is provided on this After Visit Summary.  MyChart is used to connect with patients for Virtual Visits (Telemedicine).  Patients are able to view lab/test results, encounter notes, upcoming appointments, etc.  Non-urgent messages can be sent to your provider as well.   To learn more about what you can do with MyChart, go to [email or web address].    Your next appointment:   1 year(s)  The format for your next appointment:   In Person  Provider:   Maisie Fus, MD    Other Instructions

## 2023-11-29 ENCOUNTER — Other Ambulatory Visit: Payer: Self-pay | Admitting: Physician Assistant

## 2023-12-01 MED ORDER — ROSUVASTATIN CALCIUM 20 MG PO TABS: 20.0000 mg | ORAL_TABLET | Freq: Every day | ORAL | 0 refills | Status: AC
